# Patient Record
Sex: Female | Born: 1995 | Race: Black or African American | Hispanic: No | Marital: Single | Smoking: Never smoker
Health system: Southern US, Community
[De-identification: ages and names within clinical notes are randomized; demographics above are authoritative.]

## PROBLEM LIST (undated history)

## (undated) DIAGNOSIS — J45909 Unspecified asthma, uncomplicated: Secondary | ICD-10-CM

## (undated) HISTORY — PX: OTHER SURGICAL HISTORY: SHX169

## (undated) HISTORY — PX: NO PAST SURGERIES: SHX2092

---
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
## 2015-08-23 ENCOUNTER — Ambulatory Visit
Admission: EM | Admit: 2015-08-23 | Discharge: 2015-08-23 | Disposition: A | Discharge status: Home / Self Care | Payer: Federal, State, Local not specified - PPO | Attending: Emergency Medicine | Admitting: Emergency Medicine

## 2015-08-23 ENCOUNTER — Encounter: Payer: Self-pay | Admitting: *Deleted

## 2015-08-23 DIAGNOSIS — H6593 Unspecified nonsuppurative otitis media, bilateral: Secondary | ICD-10-CM

## 2015-08-23 DIAGNOSIS — J302 Other seasonal allergic rhinitis: Secondary | ICD-10-CM

## 2015-08-23 DIAGNOSIS — J0111 Acute recurrent frontal sinusitis: Secondary | ICD-10-CM | POA: Diagnosis not present

## 2015-08-23 LAB — RAPID STREP SCREEN (MED CTR MEBANE ONLY): Streptococcus, Group A Screen (Direct): NEGATIVE

## 2015-08-23 MED ORDER — FLUTICASONE PROPIONATE 50 MCG/ACT NA SUSP: 1.0000 | Freq: Two times a day (BID) | NASAL | Status: DC

## 2015-08-23 MED ORDER — AMOXICILLIN-POT CLAVULANATE 875-125 MG PO TABS: 1.0000 | ORAL_TABLET | Freq: Two times a day (BID) | ORAL | Status: DC

## 2015-08-23 MED ORDER — SALINE SPRAY 0.65 % NA SOLN: 2.0000 | NASAL | Status: DC

## 2015-08-23 NOTE — Discharge Instructions (Signed)
Allergic Rhinitis Allergic rhinitis is when the mucous membranes in the nose respond to allergens. Allergens are particles in the air that cause your body to have an allergic reaction. This causes you to release allergic antibodies. Through a chain of events, these eventually cause you to release histamine into the blood stream. Although meant to protect the body, it is this release of histamine that causes your discomfort, such as frequent sneezing, congestion, and an itchy, runny nose.  CAUSES Seasonal allergic rhinitis (hay fever) is caused by pollen allergens that may come from grasses, trees, and weeds. Year-round allergic rhinitis (perennial allergic rhinitis) is caused by allergens such as house dust mites, pet dander, and mold spores. SYMPTOMS  Nasal stuffiness (congestion).  Itchy, runny nose with sneezing and tearing of the eyes. DIAGNOSIS Your health care provider can help you determine the allergen or allergens that trigger your symptoms. If you and your health care provider are unable to determine the allergen, skin or blood testing may be used. Your health care provider will diagnose your condition after taking your health history and performing a physical exam. Your health care provider may assess you for other related conditions, such as asthma, pink eye, or an ear infection. TREATMENT Allergic rhinitis does not have a cure, but it can be controlled by:  Medicines that block allergy symptoms. These may include allergy shots, nasal sprays, and oral antihistamines.  Avoiding the allergen. Hay fever may often be treated with antihistamines in pill or nasal spray forms. Antihistamines block the effects of histamine. There are over-the-counter medicines that may help with nasal congestion and swelling around the eyes. Check with your health care provider before taking or giving this medicine. If avoiding the allergen or the medicine prescribed do not work, there are many new medicines  your health care provider can prescribe. Stronger medicine may be used if initial measures are ineffective. Desensitizing injections can be used if medicine and avoidance does not work. Desensitization is when a patient is given ongoing shots until the body becomes less sensitive to the allergen. Make sure you follow up with your health care provider if problems continue. HOME CARE INSTRUCTIONS It is not possible to completely avoid allergens, but you can reduce your symptoms by taking steps to limit your exposure to them. It helps to know exactly what you are allergic to so that you can avoid your specific triggers. SEEK MEDICAL CARE IF:  You have a fever.  You develop a cough that does not stop easily (persistent).  You have shortness of breath.  You start wheezing.  Symptoms interfere with normal daily activities.   This information is not intended to replace advice given to you by your health care provider. Make sure you discuss any questions you have with your health care provider.   Document Released: 10/21/2000 Document Revised: 02/16/2014 Document Reviewed: 10/03/2012 Elsevier Interactive Patient Education 2016 [employer] Pharyngitis Pharyngitis is redness, pain, and swelling (inflammation) of your pharynx.  CAUSES  Pharyngitis is usually caused by infection. Most of the time, these infections are from viruses (viral) and are part of a cold. However, sometimes pharyngitis is caused by bacteria (bacterial). Pharyngitis can also be caused by allergies. Viral pharyngitis may be spread from person to person by coughing, sneezing, and personal items or utensils (cups, forks, spoons, toothbrushes). Bacterial pharyngitis may be spread from person to person by more intimate contact, such as kissing.  SIGNS AND SYMPTOMS  Symptoms of pharyngitis include:   Sore throat.  Tiredness (fatigue).   Low-grade fever.   Headache.  Joint pain and muscle aches.  Skin  rashes.  Swollen lymph nodes.  Plaque-like film on throat or tonsils (often seen with bacterial pharyngitis). DIAGNOSIS  Your health care provider will ask you questions about your illness and your symptoms. Your medical history, along with a physical exam, is often all that is needed to diagnose pharyngitis. Sometimes, a rapid strep test is done. Other lab tests may also be done, depending on the suspected cause.  TREATMENT  Viral pharyngitis will usually get better in 3-4 days without the use of medicine. Bacterial pharyngitis is treated with medicines that kill germs (antibiotics).  HOME CARE INSTRUCTIONS   Drink enough water and fluids to keep your urine clear or pale yellow.   Only take over-the-counter or prescription medicines as directed by your health care provider:   If you are prescribed antibiotics, make sure you finish them even if you start to feel better.   Do not take aspirin.   Get lots of rest.   Gargle with 8 oz of salt water ( tsp of salt per 1 qt of water) as often as every 1-2 hours to soothe your throat.   Throat lozenges (if you are not at risk for choking) or sprays may be used to soothe your throat. SEEK MEDICAL CARE IF:   You have large, tender lumps in your neck.  You have a rash.  You cough up green, yellow-brown, or bloody spit. SEEK IMMEDIATE MEDICAL CARE IF:   Your neck becomes stiff.  You drool or are unable to swallow liquids.  You vomit or are unable to keep medicines or liquids down.  You have severe pain that does not go away with the use of recommended medicines.  You have trouble breathing (not caused by a stuffy nose). MAKE SURE YOU:   Understand these instructions.  Will watch your condition.  Will get help right away if you are not doing well or get worse.   This information is not intended to replace advice given to you by your health care provider. Make sure you discuss any questions you have with your health care  provider.   Document Released: 01/26/2005 Document Revised: 11/16/2012 Document Reviewed: 10/03/2012 Elsevier Interactive Patient Education 2016 [employer] Otitis Media With Effusion Otitis media with effusion is the presence of fluid in the middle ear. This is a common problem in children, which often follows ear infections. It may be present for weeks or longer after the infection. Unlike an acute ear infection, otitis media with effusion refers only to fluid behind the ear drum and not infection. Children with repeated ear and sinus infections and allergy problems are the most likely to get otitis media with effusion. CAUSES  The most frequent cause of the fluid buildup is dysfunction of the eustachian tubes. These are the tubes that drain fluid in the ears to the back of the nose (nasopharynx). SYMPTOMS   The main symptom of this condition is hearing loss. As a result, you or your child may:  Listen to the TV at a loud volume.  Not respond to questions.  Ask "what" often when spoken to.  Mistake or confuse one sound or word for another.  There may be a sensation of fullness or pressure but usually not pain. DIAGNOSIS   Your health care provider will diagnose this condition by examining you or your child's ears.  Your health care provider may test the pressure in you  or your child's ear with a tympanometer.  A hearing test may be conducted if the problem persists. TREATMENT   Treatment depends on the duration and the effects of the effusion.  Antibiotics, decongestants, nose drops, and cortisone-type drugs (tablets or nasal spray) may not be helpful.  Children with persistent ear effusions may have delayed language or behavioral problems. Children at risk for developmental delays in hearing, learning, and speech may require referral to a specialist earlier than children not at risk.  You or your child's health care provider may suggest a referral to an ear, nose, and throat  surgeon for treatment. The following may help restore normal hearing:  Drainage of fluid.  Placement of ear tubes (tympanostomy tubes).  Removal of adenoids (adenoidectomy). HOME CARE INSTRUCTIONS   Avoid secondhand smoke.  Infants who are breastfed are less likely to have this condition.  Avoid feeding infants while they are lying flat.  Avoid known environmental allergens.  Avoid people who are sick. SEEK MEDICAL CARE IF:   Hearing is not better in 3 months.  Hearing is worse.  Ear pain.  Drainage from the ear.  Dizziness. MAKE SURE YOU:   Understand these instructions.  Will watch your condition.  Will get help right away if you are not doing well or get worse.   This information is not intended to replace advice given to you by your health care provider. Make sure you discuss any questions you have with your health care provider.   Document Released: 03/05/2004 Document Revised: 02/16/2014 Document Reviewed: 08/23/2012 Elsevier Interactive Patient Education 2016 [employer] Sinusitis, Adult Sinusitis is redness, soreness, and inflammation of the paranasal sinuses. Paranasal sinuses are air pockets within the bones of your face. They are located beneath your eyes, in the middle of your forehead, and above your eyes. In healthy paranasal sinuses, mucus is able to drain out, and air is able to circulate through them by way of your nose. However, when your paranasal sinuses are inflamed, mucus and air can become trapped. This can allow bacteria and other germs to grow and cause infection. Sinusitis can develop quickly and last only a short time (acute) or continue over a long period (chronic). Sinusitis that lasts for more than 12 weeks is considered chronic. CAUSES Causes of sinusitis include:  Allergies.  Structural abnormalities, such as displacement of the cartilage that separates your nostrils (deviated septum), which can decrease the air flow through your  nose and sinuses and affect sinus drainage.  Functional abnormalities, such as when the small hairs (cilia) that line your sinuses and help remove mucus do not work properly or are not present. SIGNS AND SYMPTOMS Symptoms of acute and chronic sinusitis are the same. The primary symptoms are pain and pressure around the affected sinuses. Other symptoms include:  Upper toothache.  Earache.  Headache.  Bad breath.  Decreased sense of smell and taste.  A cough, which worsens when you are lying flat.  Fatigue.  Fever.  Thick drainage from your nose, which often is green and may contain pus (purulent).  Swelling and warmth over the affected sinuses. DIAGNOSIS Your health care provider will perform a physical exam. During your exam, your health care provider may perform any of the following to help determine if you have acute sinusitis or chronic sinusitis:  Look in your nose for signs of abnormal growths in your nostrils (nasal polyps).  Tap over the affected sinus to check for signs of infection.  View the inside of your  sinuses using an imaging device that has a light attached (endoscope). If your health care provider suspects that you have chronic sinusitis, one or more of the following tests may be recommended:  Allergy tests.  Nasal culture. A sample of mucus is taken from your nose, sent to a lab, and screened for bacteria.  Nasal cytology. A sample of mucus is taken from your nose and examined by your health care provider to determine if your sinusitis is related to an allergy. TREATMENT Most cases of acute sinusitis are related to a viral infection and will resolve on their own within 10 days. Sometimes, medicines are prescribed to help relieve symptoms of both acute and chronic sinusitis. These may include pain medicines, decongestants, nasal steroid sprays, or saline sprays. However, for sinusitis related to a bacterial infection, your health care provider will prescribe  antibiotic medicines. These are medicines that will help kill the bacteria causing the infection. Rarely, sinusitis is caused by a fungal infection. In these cases, your health care provider will prescribe antifungal medicine. For some cases of chronic sinusitis, surgery is needed. Generally, these are cases in which sinusitis recurs more than 3 times per year, despite other treatments. HOME CARE INSTRUCTIONS  Drink plenty of water. Water helps thin the mucus so your sinuses can drain more easily.  Use a humidifier.  Inhale steam 3-4 times a day (for example, sit in the bathroom with the shower running).  Apply a warm, moist washcloth to your face 3-4 times a day, or as directed by your health care provider.  Use saline nasal sprays to help moisten and clean your sinuses.  Take medicines only as directed by your health care provider.  If you were prescribed either an antibiotic or antifungal medicine, finish it all even if you start to feel better. SEEK IMMEDIATE MEDICAL CARE IF:  You have increasing pain or severe headaches.  You have nausea, vomiting, or drowsiness.  You have swelling around your face.  You have vision problems.  You have a stiff neck.  You have difficulty breathing.   This information is not intended to replace advice given to you by your health care provider. Make sure you discuss any questions you have with your health care provider.   Document Released: 01/26/2005 Document Revised: 02/16/2014 Document Reviewed: 02/10/2011 Else[employer] Interactive Patient Education [employer]2016 [employer]

## 2015-08-23 NOTE — ED Provider Notes (Signed)
CSN: [id number]     Arrival date & time 08/23/15  1044 History   First MD Initiated Contact with Patient 08/23/15 1111     Chief Complaint  Patient presents with  . 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 female presenting with pharyngitis. The history is provided by the patient.  Sore Throat This is a recurrent problem. The current episode started 2 days ago. The problem occurs constantly. The problem has been gradually worsening. Associated symptoms include headaches. Pertinent negatives include no chest pain, no abdominal pain and no shortness of breath. The symptoms are aggravated by eating, drinking and swallowing. Nothing relieves the symptoms. She has tried rest, food and water for the symptoms. The treatment provided mild relief.    Past Medical History  Diagnosis Date  . Asthma    History reviewed. No pertinent past surgical history. History reviewed. No pertinent family history. Social History  Substance Use Topics  . Smoking status: Never Smoker   . Smokeless tobacco: Never Used  . Alcohol Use: No   OB History    No data available     Review of Systems  Constitutional: Negative for fever, chills, diaphoresis, activity change, appetite change, fatigue and unexpected weight change.  HENT: Positive for congestion, ear pain and postnasal drip. Negative for dental problem, drooling, ear discharge, facial swelling, hearing loss, mouth sores, nosebleeds, rhinorrhea, sinus pressure, sneezing, sore throat, tinnitus, trouble swallowing and voice change.   Eyes: Negative for photophobia, pain, discharge, redness, itching and visual disturbance.  Respiratory: Negative  for cough, choking, chest tightness, shortness of breath, wheezing and stridor.   Cardiovascular: Negative for chest pain, palpitations and leg swelling.  Gastrointestinal: Negative for nausea, vomiting, abdominal pain, diarrhea, constipation, blood in stool and abdominal distention.  Endocrine: Negative for cold intolerance and heat intolerance.  Genitourinary: Negative for dysuria, hematuria and difficulty urinating.  Musculoskeletal: Negative for myalgias, back pain, joint swelling, arthralgias, gait problem, neck pain and neck stiffness.  Skin: Negative for color change, pallor, rash and wound.  Allergic/Immunologic: Positive for environmental allergies. Negative for food allergies.  Neurological: Positive for headaches. Negative for dizziness, tremors, seizures, syncope, facial asymmetry, speech difficulty, weakness, light-headedness and numbness.  Hematological: Negative for adenopathy. Does not bruise/bleed easily.  Psychiatric/Behavioral: Negative for behavioral problems, confusion, sleep disturbance and agitation.    Allergies  Review of patient's allergies indicates no known allergies.  Home Medications   Prior to Admission medications   Medication Sig Start Date End Date Taking? Authorizing Provider  beclomethasone (QVAR) 80 MCG/ACT inhaler Inhale 2 puffs into the lungs as needed.   Yes Historical Provider, MD  montelukast (SINGULAIR) 10 MG tablet Take 10 mg by mouth at bedtime.   Yes Historical Provider, MD  acetaminophen (TYLENOL) 500 MG tablet Take 2 tablets (1,000 mg total) by mouth every 6 (six) hours as needed for moderate pain. 11/09/14   Barbaraann Barthelina A Zannah Melucci, NP  amoxicillin-clavulanate (AUGMENTIN) 875-125 MG tablet Take 1 tablet by mouth every 12 (twelve) hours. 08/23/15   Barbaraann Barthelina A Lakeyta Vandenheuvel, NP  fluticasone (FLONASE) 50 MCG/ACT nasal spray Place 1 spray into both nostrils 2 (two) times daily. 08/23/15   Barbaraann Barthelina A Brittannie Tawney, NP  sodium chloride (OCEAN) 0.65 % SOLN nasal spray  Place 2 sprays into  both nostrils every 2 (two) hours while awake. 08/23/15 09/09/15  Barbaraann Barthel, NP   Meds Ordered and Administered this Visit  Medications - No data to display  BP 115/80 mmHg  Pulse 71  Temp(Src) 98.5 F (36.9 C) (Oral)  Resp 18  Ht  (1.575 m)  Wt 140 lb (63.504 kg)  BMI 25.60 kg/m2  SpO2 100%  LMP 07/27/2015 No data found.   Physical Exam  Constitutional: She is oriented to person, place, and time. She appears well-developed and well-nourished. She is active and cooperative.  Non-toxic appearance. She does not have a sickly appearance. She does not appear ill. No distress.  HENT:  Head: Normocephalic and atraumatic.  Right Ear: Hearing, external ear and ear canal normal. A middle ear effusion is present.  Left Ear: Hearing, external ear and ear canal normal. A middle ear effusion is present.  Nose: Mucosal edema and rhinorrhea present. No nose lacerations, sinus tenderness, nasal deformity, septal deviation or nasal septal hematoma. No epistaxis.  No foreign bodies. Right sinus exhibits no maxillary sinus tenderness and no frontal sinus tenderness. Left sinus exhibits no maxillary sinus tenderness and no frontal sinus tenderness.  Mouth/Throat: Uvula is midline and mucous membranes are normal. Mucous membranes are not pale, not dry and not cyanotic. She does not have dentures. No oral lesions. No trismus in the jaw. Normal dentition. No dental abscesses, uvula swelling, lacerations or dental caries. Posterior oropharyngeal edema and posterior oropharyngeal erythema present. No oropharyngeal exudate or tonsillar abscesses.  Cobblestoning posterior pharynx; tonsils 1+/4 bilaterally edema/erythema; bilateral allergic shiners; bilateral nasal turbinates edema/erythema/white discharge  Eyes: Conjunctivae, EOM and lids are normal. Pupils are equal, round, and reactive to light. Right eye exhibits no chemosis, no discharge, no exudate and no hordeolum. No foreign  body present in the right eye. Left eye exhibits no chemosis, no discharge, no exudate and no hordeolum. No foreign body present in the left eye. Right conjunctiva is not injected. Right conjunctiva has no hemorrhage. Left conjunctiva is not injected. Left conjunctiva has no hemorrhage. No scleral icterus. Right eye exhibits normal extraocular motion and no nystagmus. Left eye exhibits normal extraocular motion and no nystagmus. Right pupil is round and reactive. Left pupil is round and reactive. Pupils are equal.  Neck: Trachea normal and normal range of motion. Neck supple. No tracheal tenderness, no spinous process tenderness and no muscular tenderness present. No rigidity. No tracheal deviation, no edema, no erythema and normal range of motion present. No thyroid mass and no thyromegaly present.  Cardiovascular: Normal rate, regular rhythm, S1 normal, S2 normal, normal heart sounds and intact distal pulses.  PMI is not displaced.  Exam reveals no gallop and no friction rub.   No murmur heard. Pulmonary/Chest: Effort normal and breath sounds normal. No accessory muscle usage or stridor. No respiratory distress. She has no decreased breath sounds. She has no wheezes. She has no rhonchi. She has no rales. She exhibits no tenderness.  Abdominal: Soft. She exhibits no distension.  Musculoskeletal: Normal range of motion. She exhibits no edema or tenderness.       Right shoulder: Normal.       Left shoulder: Normal.       Right hip: Normal.       Left hip: Normal.       Right knee: Normal.       Left knee: Normal.       Cervical back: Normal.       Right hand: Normal.  Left hand: Normal.  Lymphadenopathy:       Head (right side): No submental, no submandibular, no tonsillar, no preauricular, no posterior auricular and no occipital adenopathy present.       Head (left side): No submental, no submandibular, no tonsillar, no preauricular, no posterior auricular and no occipital adenopathy present.     She has no cervical adenopathy.       Right cervical: No superficial cervical, no deep cervical and no posterior cervical adenopathy present.      Left cervical: No superficial cervical, no deep cervical and no posterior cervical adenopathy present.  Neurological: She is alert and oriented to person, place, and time. She has normal strength. She is not disoriented. She displays no atrophy and no tremor. No cranial nerve deficit or sensory deficit. She exhibits normal muscle tone. She displays no seizure activity. Coordination and gait normal. GCS eye subscore is 4. GCS verbal subscore is 5. GCS motor subscore is 6.  Skin: Skin is warm, dry and intact. No abrasion, no bruising, no burn, no ecchymosis, no laceration, no lesion, no petechiae and no rash noted. She is not diaphoretic. No cyanosis or erythema. No pallor. Nails show no clubbing.  Psychiatric: She has a normal mood and affect. Her speech is normal and behavior is normal. Judgment and thought content normal. Cognition and memory are normal.  Nursing note and vitals reviewed.   ED Course  Procedures (including critical care time)  Labs Review Labs Reviewed  RAPID STREP SCREEN (NOT AT [facility])  CULTURE, GROUP A STREP [facility])    Imaging Review No results found.     MDM   1. Acute recurrent frontal sinusitis   2. Otitis media with effusion, bilateral   3. Seasonal allergic rhinitis    Patient may use normal saline nasal spray as needed.  Continue zyrtec and singulair 10mg  po daily patient just recently restarted.  Trial nasal steroid use flonase 1 spray each nostril BID.  Avoid triggers if possible.  Shower prior to bedtime if exposed to triggers.  If allergic dust/dust mites recommend mattress/pillow covers/encasements; washing linens, vacuuming, sweeping, dusting weekly.  Call or return to clinic as needed if these symptoms worsen or fail to improve as anticipated.   Exitcare handout on allergic rhinitis given to patient.   Patient verbalized understanding of instructions, agreed with plan of care and had no further questions at this time.  P2:  Avoidance and hand washing.  Supportive treatment.   No evidence of invasive bacterial infection, non toxic and well hydrated.  This is most likely self limiting viral infection.  I do not see where any further testing or imaging is necessary at this time.   I will suggest supportive care, rest, good hygiene and encourage the patient to take adequate fluids.  The patient is to return to clinic or EMERGENCY ROOM if symptoms worsen or change significantly e.g. ear pain, fever, purulent discharge from ears or bleeding.  Exitcare handout on otitis media with effusion given to patient.  Patient verbalized agreement and understanding of treatment plan.    Patient notified rapid strep negative.   Will call with throat culture results typically 48 hours once available.  School excuse given 24 hours/today.  I do not see where any further testing or imaging is necessary at this time.   I will suggest supportive care, rest, good hygiene and encourage the patient to take adequate fluids.  nasal saline 1-2 sprays each nostril prn q2h, motrin 800mg  po TID prn.  Discussed honey with lemon and salt water gargles for comfort also.  The patient is to return to clinic or EMERGENCY ROOM if symptoms worsen or change significantly e.g. fever, lethargy, SOB, wheezing.  Exitcare handout on viral illness given to patient.  Patient verbalized agreement and understanding of treatment plan.    Start augmentin 875mg  po BID in 48 hours if no improvement with flonase and saline.  No evidence of systemic bacterial infection, non toxic and well hydrated.  I do not see where any further testing or imaging is necessary at this time.   I will suggest supportive care, rest, good hygiene and encourage the patient to take adequate fluids.  The patient is to return to clinic or EMERGENCY ROOM if symptoms worsen or change  significantly.  Exitcare handout on sinusitis given to patient.  Patient verbalized agreement and understanding of treatment plan and had no further questions at this time.   P2:  Hand washing and cover cough  Restart flonase 1 spray each nostril BID, saline 2 sprays each nostril q2h prn congestion.  If no improvement with 48 hours of saline and flonase use start augmentin 875mg  po BID x 10 days.  Rx given.  No evidence of systemic bacterial infection, non toxic and well hydrated.  I do not see where any further testing or imaging is necessary at this time.   I will suggest supportive care, rest, good hygiene and encourage the patient to take adequate fluids.  The patient is to return to clinic or EMERGENCY ROOM if symptoms worsen or change significantly.  Exitcare handout on sinusitis given to patient.  Patient verbalized agreement and understanding of treatment plan and had no further questions at this time.   P2:  Hand washing and cover cough  School/work excuse note given to patient for 24 hours.  Usually no specific medical treatment is needed if a virus is causing the sore throat.  The throat most often gets better on its own within 5 to 7 days.  Antibiotic medicine does not cure viral pharyngitis.   For acute pharyngitis caused by bacteria, your healthcare provider will prescribe an antibiotic.  Marland Kitchen Do not smoke.  Marland Kitchen Avoid secondhand smoke and other air pollutants.  . Use a cool mist humidifier to add moisture to the air.  . Get plenty of rest.  . You may want to rest your throat by talking less and eating a diet that is mostly liquid or soft for a day or two.   Marland Kitchen Nonprescription throat lozenges and mouthwashes should help relieve the soreness.   . Gargling with warm saltwater and drinking warm liquids may help.  (You can make a saltwater solution by adding 1/4 teaspoon of salt to 8 ounces, or 240 mL, of warm water.)  . A nonprescription pain reliever such as tylenol 1000mg  po QID prn pain may  ease general aches and pains.   FOLLOW UP with clinic provider if no improvements in the next 7-10 days.  Patient verbalized understanding of instructions and agreed with plan of care. P2:  Hand washing and diet.    Barbaraann Barthel, NP 08/23/15 1545

## 2015-08-23 NOTE — ED Notes (Signed)
Patient started having sore throat symptoms 2 days ago. Patient reports history of strep throat in the past during the summer.

## 2015-08-25 LAB — CULTURE, GROUP A STREP (THRC)

## 2015-08-29 ENCOUNTER — Telehealth: Payer: Self-pay | Admitting: General Practice

## 2015-08-29 NOTE — Telephone Encounter (Signed)
Mother notified throat culture normal negative.  Mother reported patient taking her meds and feeling better.  No further questions at this time, verbalized understanding information.

## 2015-10-30 ENCOUNTER — Ambulatory Visit
Admission: EM | Admit: 2015-10-30 | Discharge: 2015-10-30 | Disposition: A | Discharge status: Home / Self Care | Payer: Federal, State, Local not specified - PPO | Attending: Family Medicine | Admitting: Family Medicine

## 2015-10-30 ENCOUNTER — Telehealth: Payer: Self-pay | Admitting: Family Medicine

## 2015-10-30 DIAGNOSIS — L739 Follicular disorder, unspecified: Secondary | ICD-10-CM | POA: Diagnosis not present

## 2015-10-30 MED ORDER — SULFAMETHOXAZOLE-TRIMETHOPRIM 800-160 MG PO TABS: 1.0000 | ORAL_TABLET | Freq: Two times a day (BID) | ORAL | 0 refills | Status: AC

## 2015-10-30 MED ORDER — FLUCONAZOLE 150 MG PO TABS: 150.0000 mg | ORAL_TABLET | Freq: Every day | ORAL | 0 refills | Status: DC

## 2015-10-30 NOTE — ED Triage Notes (Signed)
Patient c/o of a bump on her scalp that is causing her a lot of pain. She says it hurts to pull up her hair, she has to take tylenol to be able to do this. The bump appeared a couple of days ago.

## 2015-10-30 NOTE — Discharge Instructions (Signed)
Keep area clean and dry as discussed. Seek medical attention if symptoms persist or worsen as discussed.

## 2015-10-30 NOTE — ED Provider Notes (Signed)
[facility]    CSN: [id number] Arrival date & time: 10/30/15  [id number]  First Provider Contact:  None       History   Chief Complaint Chief Complaint  Patient presents with  . Rash    HPI Jordan Solis is a 20 y.o. female.   HPI: Patient presents today with symptoms of tender raised area on the right occiput. Patient states that she's had this for the past week. It has slowly gotten worse. She notices some sensations proximal to the area. She denies any URI symptoms, fever, malaise. She admits to wearing her hair up often. She does use oils in her hair and also braids her hair at times. She denies any new products in her hair. Past Medical History:  Diagnosis Date  . Asthma     There are no active problems to display for this patient.   History reviewed. No pertinent surgical history.  OB History    No data available       Home Medications    Prior to Admission medications   Medication Sig Start Date End Date Taking? Authorizing Provider  acetaminophen (TYLENOL) 500 MG tablet Take 2 tablets (1,000 mg total) by mouth every 6 (six) hours as needed for moderate pain. 11/09/14  Yes Tina A Betancourt, NP  montelukast (SINGULAIR) 10 MG tablet Take 10 mg by mouth at bedtime.   Yes Historical Provider, MD  amoxicillin-clavulanate (AUGMENTIN) 875-125 MG tablet Take 1 tablet by mouth every 12 (twelve) hours. 08/23/15   Barbaraann Barthelina A Betancourt, NP  beclomethasone (QVAR) 80 MCG/ACT inhaler Inhale 2 puffs into the lungs as needed.    Historical Provider, MD  fluticasone (FLONASE) 50 MCG/ACT nasal spray Place 1 spray into both nostrils 2 (two) times daily. 08/23/15   Barbaraann Barthelina A Betancourt, NP  sodium chloride (OCEAN) 0.65 % SOLN nasal spray Place 2 sprays into both nostrils every 2 (two) hours while awake. 08/23/15 09/09/15  Barbaraann Barthelina A Betancourt, NP  sulfamethoxazole-trimethoprim (BACTRIM DS,SEPTRA DS) 800-160 MG tablet Take 1 tablet by mouth 2 (two) times daily. 10/30/15 11/04/15  Jolene ProvostKirtida  Margene Cherian, MD    Family History History reviewed. No pertinent family history.  Social History Social History  Substance Use Topics  . Smoking status: Never Smoker  . Smokeless tobacco: Never Used  . Alcohol use No     Allergies   Review of patient's allergies indicates no known allergies.   Review of Systems Review of Systems: Negative except mentioned above.    Physical Exam Triage Vital Signs ED Triage Vitals  Enc Vitals Group     BP 10/30/15 0903 104/65     Pulse Rate 10/30/15 0903 72     Resp 10/30/15 0903 18     Temp 10/30/15 0903 98.1 F (36.7 C)     Temp Source 10/30/15 0903 Oral     SpO2 10/30/15 0903 100 %     Weight 10/30/15 0901 145 lb (65.8 kg)     Height 10/30/15 0901 5\' 3"  (1.6 m)     Head Circumference --      Peak Flow --      Pain Score 10/30/15 0902 7     Pain Loc --      Pain Edu? --      Excl. in GC? --    No data found.   Updated Vital Signs BP 104/65 (BP Location: Left Arm)   Pulse 72   Temp 98.1 F (36.7 C) (Oral)   Resp 18  Ht 5\' 3"  (1.6 m)   Wt 145 lb (65.8 kg)   LMP 09/26/2015   SpO2 100%   BMI 25.69 kg/m     Physical Exam:  GENERAL: NAD HEENT: no pharyngeal erythema, no exudate, no erythema of TMs, no cervical LAD RESP: CTA B CARD: RRR SKIN: approx. 5mm circular raised area of erythema with small white head, no drainage from site, mild tenderness of site, no other areas similar to this on scalp appreciated  NEURO: CN II-XII grossly intact    UC Treatments / Results  Labs (all labs ordered are listed, but only abnormal results are displayed) Labs Reviewed - No data to display  EKG  EKG Interpretation None       Radiology No results found.  Procedures Procedures (including critical care time)  Medications Ordered in UC Medications - No data to display   Initial Impression / Assessment and Plan / UC Course  I have reviewed the triage vital signs and the nursing notes.  Pertinent labs & imaging results  that were available during my care of the patient were reviewed by me and considered in my medical decision making (see chart for details).  Clinical Course   A/P: Folliculitis/Furuncle - Will treat with a few days of oral antibiotic, lesion doesn't look like shingles and there are no other lesions, encouraged warm compresses on area, avoid putting hair up tight, avoid other chemicals/oils in hair, use no fragrance antibacterial shampoo/conditioner. Seek medical attention if symptoms persist or worsen as discussed.     Final Clinical Impressions(s) / UC Diagnoses   Final diagnoses:  Folliculitis    New Prescriptions New Prescriptions   SULFAMETHOXAZOLE-TRIMETHOPRIM (BACTRIM DS,SEPTRA DS) 800-160 MG TABLET    Take 1 tablet by mouth 2 (two) times daily.     Jolene Provost, MD 10/30/15 [phone]

## 2016-05-01 DIAGNOSIS — M419 Scoliosis, unspecified: Secondary | ICD-10-CM | POA: Insufficient documentation

## 2016-05-01 DIAGNOSIS — N939 Abnormal uterine and vaginal bleeding, unspecified: Secondary | ICD-10-CM | POA: Insufficient documentation

## 2016-05-01 DIAGNOSIS — E559 Vitamin D deficiency, unspecified: Secondary | ICD-10-CM | POA: Insufficient documentation

## 2016-08-20 ENCOUNTER — Ambulatory Visit
Admission: EM | Admit: 2016-08-20 | Discharge: 2016-08-20 | Disposition: A | Discharge status: Home / Self Care | Payer: Federal, State, Local not specified - PPO | Attending: Family Medicine | Admitting: Family Medicine

## 2016-08-20 DIAGNOSIS — J3089 Other allergic rhinitis: Secondary | ICD-10-CM | POA: Diagnosis not present

## 2016-08-20 DIAGNOSIS — J45909 Unspecified asthma, uncomplicated: Secondary | ICD-10-CM

## 2016-08-20 DIAGNOSIS — R0981 Nasal congestion: Secondary | ICD-10-CM

## 2016-08-20 MED ORDER — FLUTICASONE-SALMETEROL 250-50 MCG/DOSE IN AEPB: 1.0000 | INHALATION_SPRAY | Freq: Two times a day (BID) | RESPIRATORY_TRACT | 0 refills | Status: DC

## 2016-08-20 MED ORDER — RANITIDINE HCL 150 MG PO CAPS: 150.0000 mg | ORAL_CAPSULE | Freq: Two times a day (BID) | ORAL | 0 refills | Status: DC

## 2016-08-20 MED ORDER — FLUTICASONE PROPIONATE 50 MCG/ACT NA SUSP: 2.0000 | Freq: Every day | NASAL | 0 refills | Status: DC

## 2016-08-20 NOTE — ED Triage Notes (Signed)
Patient complains of allergies to pet dander after doing the laundry. Patient states that 2 days ago she had an asthma attack and started allegra to help. Patient states that she has sinus pain and pressure and fullness in her face. Patient states that she has been noticing some shortness of breath since 2 days ago.

## 2016-08-20 NOTE — Discharge Instructions (Signed)
Continue with using rescue inhaler as needed continue with her Singulair and use Claritin daily and Zyrtec as needed if more H1 blockade is needed

## 2016-08-20 NOTE — ED Provider Notes (Signed)
[facility]    CSN: [id number] Arrival date & time: 08/20/16  1100     History   Chief Complaint Chief Complaint  Patient presents with  . Allergies    HPI Jordan Solis is a 21 y.o. female.   Patient is 21 year old black female out allergies to penicillin who reports increased nasal congestion and allergies. She's been exposed to more animals lately due to living environment and states she's having more trouble with nasal congestion feeling bad and also wheezing and shortness of breath. She states she feels overall visible and then she started becoming short of breath and difficulty breathing. She is taking Claritin and Zyrtec about 12 hours apart as needed she uses albuterol inhaler when needed for her increased bronchospasm and she is in Singulair. Despite that she still having more trouble she stop using the nasal sprays cousins she could taste them. She does not smoke, no previous surgeries no of chronic medical problems no pertinent family medical history relevant to today's visit.   The history is provided by the patient. No language interpreter was used.    Past Medical History:  Diagnosis Date  . Asthma     There are no active problems to display for this patient.   Past Surgical History:  Procedure Laterality Date  . NO PAST SURGERIES      OB History    No data available       Home Medications    Prior to Admission medications   Medication Sig Start Date End Date Taking? Authorizing Provider  albuterol (PROVENTIL HFA;VENTOLIN HFA) 108 (90 Base) MCG/ACT inhaler Inhale into the lungs every 6 (six) hours as needed for wheezing or shortness of breath.   Yes [provider]  beclomethasone (QVAR) 80 MCG/ACT inhaler Inhale 2 puffs into the lungs as needed.   Yes [provider]  fexofenadine (ALLEGRA) 180 MG tablet Take 180 mg by mouth daily.   Yes [provider]  fluticasone (FLONASE) 50 MCG/ACT nasal spray Place 1  spray into both nostrils 2 (two) times daily. 08/23/15  Yes Betancourt, Jarold Songina A, NP  montelukast (SINGULAIR) 10 MG tablet Take 10 mg by mouth at bedtime.   Yes [provider]  acetaminophen (TYLENOL) 500 MG tablet Take 2 tablets (1,000 mg total) by mouth every 6 (six) hours as needed for moderate pain. 11/09/14   Betancourt, Jarold Songina A, NP  amoxicillin-clavulanate (AUGMENTIN) 875-125 MG tablet Take 1 tablet by mouth every 12 (twelve) hours. 08/23/15   Betancourt, Jarold Songina A, NP  fluconazole (DIFLUCAN) 150 MG tablet Take 1 tablet (150 mg total) by mouth daily. 10/30/15   Jolene ProvostPatel, Kirtida, MD  fluticasone (FLONASE) 50 MCG/ACT nasal spray Place 2 sprays into both nostrils daily. 08/20/16   Hassan RowanWade, Ely Spragg, MD  Fluticasone-Salmeterol (ADVAIR DISKUS) 250-50 MCG/DOSE AEPB Inhale 1 puff into the lungs 2 (two) times daily. 08/20/16   Hassan RowanWade, Cythina Mickelsen, MD  ranitidine (ZANTAC) 150 MG capsule Take 1 capsule (150 mg total) by mouth 2 (two) times daily. 08/20/16   Hassan RowanWade, Jonmarc Bodkin, MD  sodium chloride (OCEAN) 0.65 % SOLN nasal spray Place 2 sprays into both nostrils every 2 (two) hours while awake. 08/23/15 09/09/15  BetancourtJarold Song, Tina A, NP    Family History History reviewed. No pertinent family history.  Social History Social History  Substance Use Topics  . Smoking status: Never Smoker  . Smokeless tobacco: Never Used  . Alcohol use No     Allergies   Penicillins   Review of Systems  Review of Systems  HENT: Positive for congestion, facial swelling, rhinorrhea and sinus pressure.   Eyes: Positive for itching.  All other systems reviewed and are negative.    Physical Exam Triage Vital Signs ED Triage Vitals  Enc Vitals Group     BP 08/20/16 1135 119/72     Pulse Rate 08/20/16 1135 98     Resp 08/20/16 1135 18     Temp 08/20/16 1135 99 F (37.2 C)     Temp Source 08/20/16 1135 Oral     SpO2 08/20/16 1135 100 %     Weight 08/20/16 1132 150 lb (68 kg)     Height 08/20/16 1132 5\' 2"  (1.575 m)     Head  Circumference --      Peak Flow --      Pain Score 08/20/16 1132 8     Pain Loc --      Pain Edu? --      Excl. in GC? --    No data found.   Updated Vital Signs BP 119/72 (BP Location: Left Arm)   Pulse 98   Temp 99 F (37.2 C) (Oral)   Resp 18   Ht 5\' 2"  (1.575 m)   Wt 150 lb (68 kg)   LMP 07/30/2016   SpO2 100%   BMI 27.44 kg/m   Visual Acuity Right Eye Distance:   Left Eye Distance:   Bilateral Distance:    Right Eye Near:   Left Eye Near:    Bilateral Near:     Physical Exam  Constitutional: She is oriented to person, place, and time. She appears well-developed and well-nourished.  HENT:  Head: Normocephalic and atraumatic.  Right Ear: Hearing, tympanic membrane, external ear and ear canal normal.  Left Ear: Hearing, tympanic membrane, external ear and ear canal normal.  Nose: Mucosal edema present. Right sinus exhibits maxillary sinus tenderness and frontal sinus tenderness. Left sinus exhibits maxillary sinus tenderness and frontal sinus tenderness.  Mouth/Throat: Uvula is midline and oropharynx is clear and moist. No posterior oropharyngeal edema.  Eyes: Pupils are equal, round, and reactive to light.  Neck: Normal range of motion. Neck supple.  Cardiovascular: Normal rate.   Pulmonary/Chest: Effort normal and breath sounds normal.  Lymphadenopathy:    She has no cervical adenopathy.  Neurological: She is alert and oriented to person, place, and time.  Skin: Skin is warm.  Psychiatric: She has a normal mood and affect.  Vitals reviewed.    UC Treatments / Results  Labs (all labs ordered are listed, but only abnormal results are displayed) Labs Reviewed - No data to display  EKG  EKG Interpretation None       Radiology No results found.  Procedures Procedures (including critical care time)  Medications Ordered in UC Medications - No data to display   Initial Impression / Assessment and Plan / UC Course  I have reviewed the triage  vital signs and the nursing notes.  Pertinent labs & imaging results that were available during my care of the patient were reviewed by me and considered in my medical decision making (see chart for details).    percent patient how to use a steroid nasal spray hopefully she will tolerate them now and gave a prescription for Flonase. Since is also component of shortness of breath we'll place on Advair discus 250/50 one puff twice a day and lastly I'm going to recommend Zantac 150 twice a day H2-blocker to take with the Claritin and Zyrtec  follow-up PCP in 2 weeks not better work note given for today and tomorrow as well    Final Clinical Impressions(s) / UC Diagnoses   Final diagnoses:  Asthma due to environmental allergies  Environmental and seasonal allergies    New Prescriptions New Prescriptions   FLUTICASONE (FLONASE) 50 MCG/ACT NASAL SPRAY    Place 2 sprays into both nostrils daily.   FLUTICASONE-SALMETEROL (ADVAIR DISKUS) 250-50 MCG/DOSE AEPB    Inhale 1 puff into the lungs 2 (two) times daily.   RANITIDINE (ZANTAC) 150 MG CAPSULE    Take 1 capsule (150 mg total) by mouth 2 (two) times daily.     Note: This dictation was prepared with Dragon dictation along with smaller phrase technology. Any transcriptional errors that result from this process are unintentional.   Hassan Rowan, MD 08/20/16 1249

## 2016-08-31 ENCOUNTER — Ambulatory Visit
Admission: EM | Admit: 2016-08-31 | Discharge: 2016-08-31 | Disposition: A | Discharge status: Home / Self Care | Payer: Federal, State, Local not specified - PPO | Attending: Family Medicine | Admitting: Family Medicine

## 2016-08-31 ENCOUNTER — Encounter: Payer: Self-pay | Admitting: *Deleted

## 2016-08-31 DIAGNOSIS — R05 Cough: Secondary | ICD-10-CM | POA: Diagnosis not present

## 2016-08-31 DIAGNOSIS — J4520 Mild intermittent asthma, uncomplicated: Secondary | ICD-10-CM

## 2016-08-31 DIAGNOSIS — J45909 Unspecified asthma, uncomplicated: Secondary | ICD-10-CM

## 2016-08-31 DIAGNOSIS — R490 Dysphonia: Secondary | ICD-10-CM

## 2016-08-31 MED ORDER — BENZONATATE 100 MG PO CAPS: 100.0000 mg | ORAL_CAPSULE | Freq: Three times a day (TID) | ORAL | 0 refills | Status: DC

## 2016-08-31 MED ORDER — IPRATROPIUM-ALBUTEROL 0.5-2.5 (3) MG/3ML IN SOLN
3.0000 mL | Freq: Once | RESPIRATORY_TRACT | Status: AC
  Administered 2016-08-31: 3 mL via RESPIRATORY_TRACT

## 2016-08-31 MED ORDER — MONTELUKAST SODIUM 10 MG PO TABS: 10.0000 mg | ORAL_TABLET | Freq: Every day | ORAL | 0 refills | Status: DC

## 2016-08-31 NOTE — ED Provider Notes (Signed)
CSN: [id number]     Arrival date & time 08/31/16  1405 History   First MD Initiated Contact with Patient 08/31/16 1442     Chief Complaint  Patient presents with  . Cough  . Hoarse   (Consider location/radiation/quality/duration/timing/severity/associated sxs/prior Treatment) 21 yo F seen last week with diagnosis asthma...using her Advair and albuterol as orderd. Taking Claritin and requesting refill on montelucast, Started Zantac but didn't understand Rx and stopped again  Using Flonase but "doesn't like the taste"- taking Claritin daily Now anxious because experiencing hoarseness and some mucous in cough in early morning Having cough No fever  No malaise  Denies GERD awareness daytime Leaving on 2 week vacation and concerned about having correct medications/refills Primary Care with [facility]       Past Medical History:  Diagnosis Date  . Asthma    Past Surgical History:  Procedure Laterality Date  . NO PAST SURGERIES     History reviewed. No pertinent family history. Social History  Substance Use Topics  . Smoking status: Never Smoker  . Smokeless tobacco: Never Used  . Alcohol use No   OB History    No data available     Review of Systems  Constitutional: Negative.   HENT: Positive for voice change.   Eyes: Negative.   Respiratory: Positive for cough. Negative for wheezing.   Cardiovascular: Negative.   Gastrointestinal: Negative.   Endocrine: Negative.   Genitourinary: Negative.   Musculoskeletal: Negative.   Allergic/Immunologic: Positive for environmental allergies.  Neurological: Negative.   Hematological: Negative.   Psychiatric/Behavioral: Negative.     Allergies  Penicillins  Home Medications   Prior to Admission medications   Medication Sig Start Date End Date Taking? Authorizing Provider  albuterol (PROVENTIL HFA;VENTOLIN HFA) 108 (90 Base) MCG/ACT inhaler Inhale into the lungs every 6 (six) hours as needed for wheezing or shortness of  breath.   Yes [provider]  beclomethasone (QVAR) 80 MCG/ACT inhaler Inhale 2 puffs into the lungs as needed.   Yes [provider]  fexofenadine (ALLEGRA) 180 MG tablet Take 180 mg by mouth daily.   Yes [provider]  fluticasone (FLONASE) 50 MCG/ACT nasal spray Place 1 spray into both nostrils 2 (two) times daily. 08/23/15  Yes Betancourt, Jarold Song, NP  fluticasone (FLONASE) 50 MCG/ACT nasal spray Place 2 sprays into both nostrils daily. 08/20/16  Yes Hassan Rowan, MD  ranitidine (ZANTAC) 150 MG capsule Take 1 capsule (150 mg total) by mouth 2 (two) times daily. 08/20/16  Yes Hassan Rowan, MD  acetaminophen (TYLENOL) 500 MG tablet Take 2 tablets (1,000 mg total) by mouth every 6 (six) hours as needed for moderate pain. 11/09/14   Betancourt, Jarold Song, NP  benzonatate (TESSALON) 100 MG capsule Take 1 capsule (100 mg total) by mouth every 8 (eight) hours. 08/31/16   Rae Halsted, PA-C  fluconazole (DIFLUCAN) 150 MG tablet Take 1 tablet (150 mg total) by mouth daily. 10/30/15   Jolene Provost, MD  Fluticasone-Salmeterol (ADVAIR DISKUS) 250-50 MCG/DOSE AEPB Inhale 1 puff into the lungs 2 (two) times daily. 08/20/16   Hassan Rowan, MD  montelukast (SINGULAIR) 10 MG tablet Take 1 tablet (10 mg total) by mouth at bedtime. 08/31/16   Rae Halsted, PA-C  sodium chloride (OCEAN) 0.65 % SOLN nasal spray Place 2 sprays into both nostrils every 2 (two) hours while awake. 08/23/15 09/09/15  Betancourt, Jarold Song, NP   Meds Ordered and Administered this Visit   Medications  ipratropium-albuterol (DUONEB)  0.5-2.5 (3) MG/3ML nebulizer solution 3 mL (3 mLs Nebulization Given 08/31/16 1458)    BP 108/76 (BP Location: Left Arm)   Pulse 79   Temp 98.8 F (37.1 C) (Oral)   Resp 16   Ht 5\' 2"  (1.575 m)   Wt 150 lb (68 kg)   LMP 08/24/2016 (Approximate)   SpO2 100%   BMI 27.44 kg/m  No data found.   Physical Exam  Constitutional: She is oriented to person, place, and time. She appears  well-developed and well-nourished.  HENT:  Head: Normocephalic and atraumatic.  Eyes: Pupils are equal, round, and reactive to light. EOM are normal.  Neck: Normal range of motion. Neck supple.  Cardiovascular: Normal rate and regular rhythm.   Pulmonary/Chest: Effort normal and breath sounds normal. She has no wheezes.  Non productive cough, mild  Abdominal: Soft. Bowel sounds are normal.  Musculoskeletal: Normal range of motion.  Neurological: She is alert and oriented to person, place, and time.  Skin: Skin is warm and dry.  Psychiatric: She has a normal mood and affect.    Urgent Care Course     Procedures (including critical care time)  Duo-Neb   Well tolerated , feels better, cough resolved  MDM   1. Mild asthma without complication, unspecified whether persistent    Plan:  diagnosis reviewed with patient/   Will refill her montelukast at patient request - leaving on vacation Rx Benzonatate requested for cough--discussed careful effective use of inhalers And medications Re try Zantac 150 mg HS as previously ordered. Follow up with PCP  Rx as per orders;  benefits, risks, potential side effects reviewed   Recommend supportive treatment with cyclic tylenol and ibuprofen Seek additional medical care if symptoms worsen or are not improving    Rae HalstedLee, Rhylan Gross W, PA-C 08/31/16 1601

## 2016-08-31 NOTE — Discharge Instructions (Signed)
°  Will refill her montelukast at patient request - leavig on vacation Rx Benzonatate  For cough--discussed careful effective use of inhalers Re try Zantac 150 mg HS as previously ordered. Follow up with PCP

## 2016-08-31 NOTE — ED Triage Notes (Signed)
Pt seen here last week with URI type symptoms which have since developed into a productive cough- green, and hoarseness. Pt feels worse now.

## 2016-09-07 NOTE — Telephone Encounter (Signed)
Not seen

## 2016-10-25 IMAGING — CR DG TOE 5TH 2+V*L*
3 series · 3 of 3 positions shown · non-contrast
Comparison: None.

CLINICAL DATA: Laceration to the left fifth toe

EXAM:
DG TOE 5TH LEFT

[toe ap]
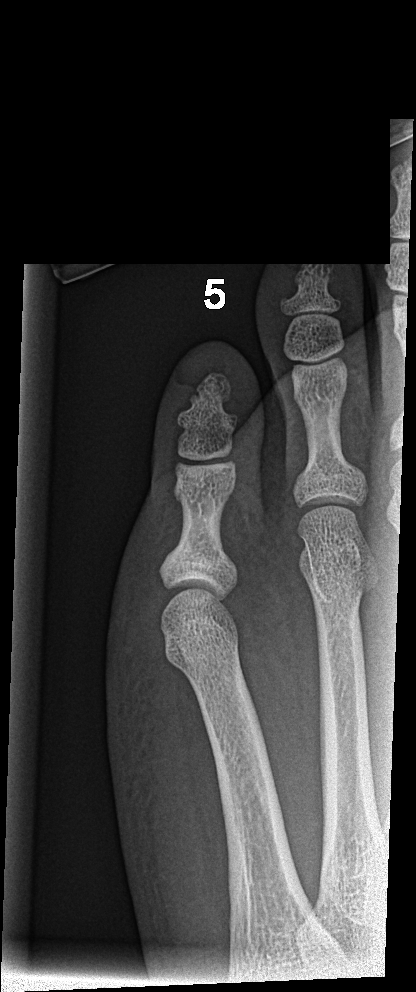

[toe obl]
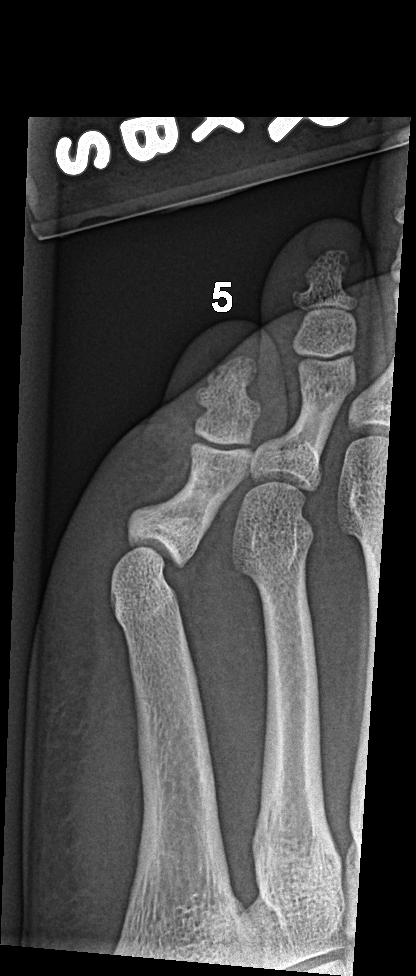

[toe lat]
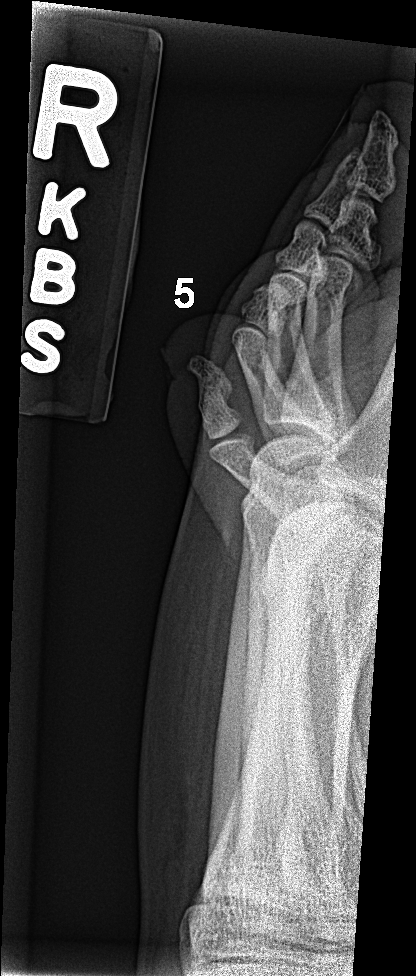

[3 of 3 positions shown; findings below may reference images not displayed]

FINDINGS: There is no evidence of fracture or dislocation. There is no
evidence of arthropathy or other focal bone abnormality. Soft
tissues are unremarkable. No radiopaque foreign body.
IMPRESSION: Negative.

## 2016-12-01 ENCOUNTER — Ambulatory Visit
Admission: EM | Admit: 2016-12-01 | Discharge: 2016-12-01 | Disposition: A | Discharge status: Home / Self Care | Payer: Federal, State, Local not specified - PPO | Attending: Family Medicine | Admitting: Family Medicine

## 2016-12-01 ENCOUNTER — Encounter: Payer: Self-pay | Admitting: Emergency Medicine

## 2016-12-01 DIAGNOSIS — L509 Urticaria, unspecified: Secondary | ICD-10-CM | POA: Diagnosis not present

## 2016-12-01 DIAGNOSIS — L232 Allergic contact dermatitis due to cosmetics: Secondary | ICD-10-CM

## 2016-12-01 MED ORDER — METHYLPREDNISOLONE 4 MG PO TBPK: ORAL_TABLET | ORAL | 0 refills | Status: DC

## 2016-12-01 NOTE — ED Provider Notes (Signed)
[facility]    CSN: [id number] Arrival date & time: 12/01/16  1059     History   Chief Complaint Chief Complaint  Patient presents with  . Pruritis  . Rash    HPI Jordan Solis is a 21 y.o. female.   HPI  Is a 21 year old female who complains of itching rash with burning and swelling with a small amount of hives that she noticed 2 days ago after applying new makeup powder. She is been taken Benadryl at nighttime. She states that it is improving over time but still present. She also has had a acne type breakout over her forehead which is not usually present in this position. He has since stopped using the powder. In addition she has noticed some increase in her asthma and has been using her inhalers more frequently than usual.          Past Medical History:  Diagnosis Date  . Asthma     There are no active problems to display for this patient.   Past Surgical History:  Procedure Laterality Date  . NO PAST SURGERIES      OB History    No data available       Home Medications    Prior to Admission medications   Medication Sig Start Date End Date Taking? Authorizing Provider  acetaminophen (TYLENOL) 500 MG tablet Take 2 tablets (1,000 mg total) by mouth every 6 (six) hours as needed for moderate pain. 11/09/14   Betancourt, Jarold Song, NP  albuterol (PROVENTIL HFA;VENTOLIN HFA) 108 (90 Base) MCG/ACT inhaler Inhale into the lungs every 6 (six) hours as needed for wheezing or shortness of breath.    [provider]  fexofenadine (ALLEGRA) 180 MG tablet Take 180 mg by mouth daily.    [provider]  fluticasone (FLONASE) 50 MCG/ACT nasal spray Place 2 sprays into both nostrils daily. 08/20/16   Hassan Rowan, MD  methylPREDNISolone (MEDROL DOSEPAK) 4 MG TBPK tablet Take per package instructions 12/01/16   Ovid Curd P, PA-C  montelukast (SINGULAIR) 10 MG tablet Take 1 tablet (10 mg total) by mouth at bedtime. 08/31/16   Rae Halsted, PA-C    Family History History reviewed. No pertinent family history.  Social History Social History  Substance Use Topics  . Smoking status: Never Smoker  . Smokeless tobacco: Never Used  . Alcohol use No     Allergies   Penicillins   Review of Systems Review of Systems  Constitutional: Positive for activity change. Negative for appetite change, chills, fatigue and fever.  Respiratory: Positive for shortness of breath. Negative for wheezing.   All other systems reviewed and are negative.    Physical Exam Triage Vital Signs ED Triage Vitals  Enc Vitals Group     BP 12/01/16 1113 112/75     Pulse Rate 12/01/16 1113 79     Resp 12/01/16 1113 14     Temp 12/01/16 1113 98.4 F (36.9 C)     Temp Source 12/01/16 1113 Oral     SpO2 12/01/16 1113 100 %     Weight 12/01/16 1110 175 lb 12.8 oz (79.7 kg)     Height 12/01/16 1110 5\' 3"  (1.6 m)     Head Circumference --      Peak Flow --      Pain Score 12/01/16 1110 0     Pain Loc --      Pain Edu? --      Excl. in  GC? --    No data found.   Updated Vital Signs BP 112/75 (BP Location: Left Arm)   Pulse 79   Temp 98.4 F (36.9 C) (Oral)   Resp 14   Ht 5\' 3"  (1.6 m)   Wt 175 lb 12.8 oz (79.7 kg)   LMP 11/24/2016 (Approximate)   SpO2 100%   BMI 31.14 kg/m   Visual Acuity Right Eye Distance:   Left Eye Distance:   Bilateral Distance:    Right Eye Near:   Left Eye Near:    Bilateral Near:     Physical Exam  Constitutional: She is oriented to person, place, and time. She appears well-developed and well-nourished. No distress.  HENT:  Head: Normocephalic.  Eyes: Pupils are equal, round, and reactive to light. Right eye exhibits no discharge. Left eye exhibits no discharge.  Neck: Normal range of motion.  Pulmonary/Chest: Effort normal and breath sounds normal.  Musculoskeletal: Normal range of motion.  Neurological: She is alert and oriented to person, place, and time.  Skin: Skin is warm and dry. Rash  noted. She is not diaphoretic.  Refer to photographs for details  Psychiatric: She has a normal mood and affect. Her behavior is normal. Judgment and thought content normal.  Nursing note and vitals reviewed.          UC Treatments / Results  Labs (all labs ordered are listed, but only abnormal results are displayed) Labs Reviewed - No data to display  EKG  EKG Interpretation None       Radiology No results found.  Procedures Procedures (including critical care time)  Medications Ordered in UC Medications - No data to display   Initial Impression / Assessment and Plan / UC Course  I have reviewed the triage vital signs and the nursing notes.  Pertinent labs & imaging results that were available during my care of the patient were reviewed by me and considered in my medical decision making (see chart for details).    Plan: 1. Test/x-ray results and diagnosis reviewed with patient 2. rx as per orders; risks, benefits, potential side effects reviewed with patient 3. Recommend supportive treatment with   use of the prednisone taper. He has already stopped using the makeup and will not use it again. Cause of the urticaria type reaction we will place her on an H2 blocker and H1 blocker for 3-5 days. If she is not improving I have given her the name of a dermatologist that she can see for further evaluation. 4. F/u prn if symptoms worsen or don't improve   Final Clinical Impressions(s) / UC Diagnoses   Final diagnoses:  Allergic contact dermatitis due to cosmetics  Urticaria    New Prescriptions Discharge Medication List as of 12/01/2016 12:13 PM    START taking these medications   Details  methylPREDNISolone (MEDROL DOSEPAK) 4 MG TBPK tablet Take per package instructions, Normal         Controlled Substance Prescriptions [place] Controlled Substance Registry consulted? Not Applicable   Lutricia FeilRoemer, Merle Whitehorn P, PA-C 12/01/16 1530

## 2016-12-01 NOTE — Discharge Instructions (Signed)
Take Zantac 150 mg twice daily for 3 days. Use Zyrtec daily as normal take up to 2 times per day if itching is severe. Benadryl 25-50 mg at bedtime if you having some itching as well. Take the Medrol Dosepak as directed on the package. If you are not improving I have given you a name of a dermatologist to follow-up with.

## 2016-12-01 NOTE — ED Triage Notes (Signed)
Patient c/o itching and rash on her face that started 2 days ago.  Patient states that she used a new make-up powder on her face 2 days ago.

## 2017-05-04 ENCOUNTER — Encounter: Payer: Self-pay | Admitting: *Deleted

## 2017-05-04 ENCOUNTER — Ambulatory Visit
Admission: EM | Admit: 2017-05-04 | Discharge: 2017-05-04 | Disposition: A | Discharge status: Home / Self Care | Payer: Federal, State, Local not specified - PPO | Attending: Emergency Medicine | Admitting: Emergency Medicine

## 2017-05-04 DIAGNOSIS — J4521 Mild intermittent asthma with (acute) exacerbation: Secondary | ICD-10-CM | POA: Diagnosis not present

## 2017-05-04 DIAGNOSIS — J040 Acute laryngitis: Secondary | ICD-10-CM

## 2017-05-04 DIAGNOSIS — R05 Cough: Secondary | ICD-10-CM

## 2017-05-04 DIAGNOSIS — R058 Other specified cough: Secondary | ICD-10-CM

## 2017-05-04 MED ORDER — PREDNISONE 50 MG PO TABS: 50.0000 mg | ORAL_TABLET | Freq: Every day | ORAL | 0 refills | Status: AC

## 2017-05-04 MED ORDER — FLUTICASONE PROPIONATE 50 MCG/ACT NA SUSP: 2.0000 | Freq: Every day | NASAL | 0 refills | Status: DC

## 2017-05-04 MED ORDER — AEROCHAMBER PLUS MISC: 2 refills | Status: AC

## 2017-05-04 MED ORDER — ALBUTEROL SULFATE HFA 108 (90 BASE) MCG/ACT IN AERS: 2.0000 | INHALATION_SPRAY | RESPIRATORY_TRACT | 0 refills | Status: DC | PRN

## 2017-05-04 MED ORDER — HYDROCOD POLST-CPM POLST ER 10-8 MG/5ML PO SUER: 5.0000 mL | Freq: Two times a day (BID) | ORAL | 0 refills | Status: DC | PRN

## 2017-05-04 NOTE — ED Triage Notes (Signed)
Pt states URI type symptoms 1 week ago which resolved but a non-productive cough persists since then. Gradual onset hoarseness 3-4 days ago which is worse today.

## 2017-05-04 NOTE — ED Provider Notes (Signed)
HPI  SUBJECTIVE:  Jordan Solis is a 22 y.o. female who presents with persistent cough for the past 4-5 days after recovering from a URI last week and laryngitis starting yesterday.  She states that the URI got better with the chills, nasal congestion, rhinorrhea resolving but that the cough is lingering.  She reports wheezing, dyspnea on exertion, states that she is waking up at night coughing.  She reports chest congestion.  No fevers, chest pain, shortness of breath, postnasal drip, sinus pain or pressure.  She also reports losing her voice yesterday.  She denies sore throat, sensation of her throat swelling shut, drooling, trismus, GERD symptoms.  She tried Mucinex, vitamin C which have helped.  She states that warm fluids also help.  Symptoms are worse with drinking cold fluids. She has a past medical history of asthma and has an albuterol inhaler at home.  She does not have a spacer.  She has not used her albuterol inhaler while she has been sick.  Also history of sinusitis.  No history of diabetes, hypertension, GERD.  LMP: 3/10.  Denies the possibility of being pregnant.  PMD: Dr. Greggory Stallion at [facility] primary care.    Past Medical History:  Diagnosis Date  . Asthma     Past Surgical History:  Procedure Laterality Date  . NO PAST SURGERIES      Family History  Problem Relation Age of Onset  . Healthy Mother   . Healthy Father     Social History   Tobacco Use  . Smoking status: Never Smoker  . Smokeless tobacco: Never Used  Substance Use Topics  . Alcohol use: No  . Drug use: No    No current facility-administered medications for this encounter.   Current Outpatient Medications:  .  acetaminophen (TYLENOL) 500 MG tablet, Take 2 tablets (1,000 mg total) by mouth every 6 (six) hours as needed for moderate pain., Disp: 60 tablet, Rfl: 0 .  albuterol (PROVENTIL HFA;VENTOLIN HFA) 108 (90 Base) MCG/ACT inhaler, Inhale 2 puffs into the lungs every 4 (four) hours as needed for  wheezing or shortness of breath., Disp: 1 Inhaler, Rfl: 0 .  chlorpheniramine-HYDROcodone (TUSSIONEX PENNKINETIC ER) 10-8 MG/5ML SUER, Take 5 mLs by mouth every 12 (twelve) hours as needed for cough., Disp: 120 mL, Rfl: 0 .  fluticasone (FLONASE) 50 MCG/ACT nasal spray, Place 2 sprays into both nostrils daily., Disp: 16 g, Rfl: 0 .  montelukast (SINGULAIR) 10 MG tablet, Take 1 tablet (10 mg total) by mouth at bedtime., Disp: 30 tablet, Rfl: 0 .  predniSONE (DELTASONE) 50 MG tablet, Take 1 tablet (50 mg total) by mouth daily with breakfast for 5 days., Disp: 5 tablet, Rfl: 0 .  Spacer/Aero-Holding Chambers (AEROCHAMBER PLUS) inhaler, Use as instructed, Disp: 1 each, Rfl: 2  Allergies  Allergen Reactions  . Penicillins Anaphylaxis     ROS  As noted in HPI.   Physical Exam  BP 122/68 (BP Location: Left Arm)   Pulse 84   Temp 98.1 F (36.7 C) (Oral)   Resp 16   Ht 5\' 2"  (1.575 m)   Wt 180 lb (81.6 kg)   SpO2 100%   BMI 32.92 kg/m   Constitutional: Well developed, well nourished, no acute distress Eyes:  EOMI, conjunctiva normal bilaterally HENT: Normocephalic, atraumatic,mucus membranes moist.  Mucoid nasal congestion, erythematous, swollen turbinates.  No sinus tenderness.  Normal tonsils, uvula midline.  Positive cobblestoning.  No obvious postnasal drip. Speaking in a whisper. No muffled, hot potato  voice. Respiratory: Normal inspiratory effort, lungs clear bilaterally, fair air movement.  No chest wall tenderness Cardiovascular: Normal rate regular rhythm, no murmurs, rubs, gallops GI: nondistended skin: No rash, skin intact Musculoskeletal: no deformities Neurologic: Alert & oriented x 3, no focal neuro deficits Psychiatric: Speech and behavior appropriate   ED Course   Medications - No data to display  No orders of the defined types were placed in this encounter.   No results found for this or any previous visit (from the past 24 hour(s)). No results found.  ED  Clinical Impression  Post-viral cough syndrome  Mild intermittent asthma with acute exacerbation   ED Assessment/Plan  [place] Narcotic database reviewed for this patient, and feel that the risk/benefit ratio today is favorable for proceeding with a prescription for controlled substance.  No opiate prescriptions in the past 2 years.  Presentation consistent with a post viral cough syndrome with perhaps a mild asthma exacerbation, and laryngitis.  Doubt epiglottitis or impending airway obstruction.  Will send home with 2 puffs from her albuterol inhaler using a spacer every 4-6 hours, Flonase, saline nasal irrigation with a Lloyd HugerNeil med sinus rinse, Mucinex D, prednisone 50 mg for 5 days, Tussionex for the cough at night.  Follow-up with PMD as needed, to the ER if she gets worse.  Discussed  MDM, plan and followup with patient. Discussed sn/sx that should prompt return to the ED. patient agrees with plan.   Meds ordered this encounter  Medications  . albuterol (PROVENTIL HFA;VENTOLIN HFA) 108 (90 Base) MCG/ACT inhaler    Sig: Inhale 2 puffs into the lungs every 4 (four) hours as needed for wheezing or shortness of breath.    Dispense:  1 Inhaler    Refill:  0  . fluticasone (FLONASE) 50 MCG/ACT nasal spray    Sig: Place 2 sprays into both nostrils daily.    Dispense:  16 g    Refill:  0  . Spacer/Aero-Holding Chambers (AEROCHAMBER PLUS) inhaler    Sig: Use as instructed    Dispense:  1 each    Refill:  2  . chlorpheniramine-HYDROcodone (TUSSIONEX PENNKINETIC ER) 10-8 MG/5ML SUER    Sig: Take 5 mLs by mouth every 12 (twelve) hours as needed for cough.    Dispense:  120 mL    Refill:  0  . predniSONE (DELTASONE) 50 MG tablet    Sig: Take 1 tablet (50 mg total) by mouth daily with breakfast for 5 days.    Dispense:  5 tablet    Refill:  0    *This clinic note was created using Scientist, clinical (histocompatibility and immunogenetics)Dragon dictation software. Therefore, there may be occasional mistakes despite careful proofreading.   ?    Domenick GongMortenson, Adriene Knipfer, MD 05/04/17 1740

## 2017-05-04 NOTE — Discharge Instructions (Addendum)
2 puffs from your albuterol inhaler using a spacer every 4-6 hours, Flonase, saline nasal irrigation with a Lloyd HugerNeil med sinus rinse, Mucinex D, prednisone 50 mg for 5 days, Tussionex for the cough at night.  Follow-up with your care physician as needed especially if you are not getting better in about 5 days, and go to the ER if you get worse.

## 2017-07-04 ENCOUNTER — Other Ambulatory Visit: Payer: Self-pay

## 2017-07-04 ENCOUNTER — Encounter: Payer: Self-pay | Admitting: Gynecology

## 2017-07-04 ENCOUNTER — Ambulatory Visit
Admission: EM | Admit: 2017-07-04 | Discharge: 2017-07-04 | Disposition: A | Discharge status: Home / Self Care | Payer: Federal, State, Local not specified - PPO | Attending: Family Medicine | Admitting: Family Medicine

## 2017-07-04 DIAGNOSIS — J309 Allergic rhinitis, unspecified: Secondary | ICD-10-CM

## 2017-07-04 DIAGNOSIS — J4521 Mild intermittent asthma with (acute) exacerbation: Secondary | ICD-10-CM

## 2017-07-04 DIAGNOSIS — J45901 Unspecified asthma with (acute) exacerbation: Secondary | ICD-10-CM

## 2017-07-04 MED ORDER — ALBUTEROL SULFATE HFA 108 (90 BASE) MCG/ACT IN AERS: 2.0000 | INHALATION_SPRAY | RESPIRATORY_TRACT | 0 refills | Status: DC | PRN

## 2017-07-04 MED ORDER — PREDNISONE 20 MG PO TABS: 40.0000 mg | ORAL_TABLET | Freq: Every day | ORAL | 0 refills | Status: AC

## 2017-07-04 NOTE — ED Triage Notes (Signed)
Per patient had sinus problem/ allergies. Pt. Stated had an asthma attack with her allergy / per patient cannot t find her asthma medication.

## 2017-07-04 NOTE — ED Provider Notes (Signed)
[facility] ____________________________________________  Time seen: Approximately 3:48 PM  I have reviewed the triage vital signs and the nursing notes.   HISTORY  Chief Complaint Sinus Problem   HPI Jordan Solis is a 22 y.o. female presented for evaluation of asthma complaints.  Patient states that yesterday she forgot to take her allergy medicine on time, and reports that afternoon she had a lot of sneezing back to back.  States repetitive sneezing runny nose then led into asthma, stating she began to wheeze.  States that she could not find her albuterol inhaler.  States she then had to call EMS for albuterol treatment which helped.  States that she did feel better after the breathing treatment, and presenting today for request of albuterol refill.  States did take her allergy medicine today which further helped symptoms.  Denies any recent fevers, increased cough or congestion compared to her normal allergy symptoms.  Denies sinus pain.  Reports continues to eat and drink well.  States some tightness in her chest consistent with her normal allergies, denies otherwise chest pain or shortness of breath. Denies chest pain, shortness of breath, abdominal pain,  extremity swelling or rash. Denies recent sickness. Denies recent antibiotic use.   [place]: PCP Patient's last menstrual period was 06/04/2017.Denies pregnancy.   Past Medical History:  Diagnosis Date  . Asthma     There are no active problems to display for this patient.   Past Surgical History:  Procedure Laterality Date  . NO PAST SURGERIES       No current facility-administered medications for this encounter.   Current Outpatient Medications:  .  albuterol (PROVENTIL HFA;VENTOLIN HFA) 108 (90 Base) MCG/ACT inhaler, Inhale 2 puffs into the lungs every 4 (four) hours as needed for wheezing or shortness of breath., Disp: 1 Inhaler, Rfl: 0 .  cetirizine HCl (ZYRTEC) 1 MG/ML solution,  Take by mouth daily., Disp: , Rfl:  .  montelukast (SINGULAIR) 10 MG tablet, Take by mouth., Disp: , Rfl:  .  Spacer/Aero-Holding Chambers (AEROCHAMBER PLUS) inhaler, Use as instructed, Disp: 1 each, Rfl: 2 .  albuterol (PROVENTIL HFA;VENTOLIN HFA) 108 (90 Base) MCG/ACT inhaler, Inhale 2 puffs into the lungs every 4 (four) hours as needed for wheezing or shortness of breath., Disp: 1 Inhaler, Rfl: 0 .  predniSONE (DELTASONE) 20 MG tablet, Take 2 tablets (40 mg total) by mouth daily for 3 days., Disp: 6 tablet, Rfl: 0  Allergies Penicillins  Family History  Problem Relation Age of Onset  . Healthy Mother   . Healthy Father     Social History Social History   Tobacco Use  . Smoking status: Never Smoker  . Smokeless tobacco: Never Used  Substance Use Topics  . Alcohol use: No  . Drug use: No    Review of Systems Constitutional: No fever ENT: No sore throat. Cardiovascular: Denies chest pain. Respiratory: Denies shortness of breath. Gastrointestinal: No abdominal pain.   Musculoskeletal: Negative for back pain. Skin: Negative for rash.   ____________________________________________   PHYSICAL EXAM:  VITAL SIGNS: ED Triage Vitals  Enc Vitals Group     BP 07/04/17 1441 118/81     Pulse Rate 07/04/17 1441 76     Resp 07/04/17 1441 16     Temp 07/04/17 1441 98.9 F (37.2 C)     Temp Source 07/04/17 1441 Oral     SpO2 07/04/17 1441 100 %     Weight 07/04/17 1443 170 lb (77.1 kg)  Height 07/04/17 1443  (1.575 m)     Head Circumference --      Peak Flow --      Pain Score 07/04/17 1443 5     Pain Loc --      Pain Edu? --      Excl. in GC? --    Constitutional: Alert and oriented. Well appearing and in no acute distress. Eyes: Conjunctivae are normal. PERRL. EOMI. Head: Atraumatic.  Mild bilateral frontal and maxillary sinus tenderness palpation.  No swelling. No erythema.  Ears: no erythema, normal TMs bilaterally.   Nose:Nasal congestion   Mouth/Throat:  Mucous membranes are moist. No pharyngeal erythema. No tonsillar swelling or exudate.  Neck: No stridor.  No cervical spine tenderness to palpation. Hematological/Lymphatic/Immunilogical: No cervical lymphadenopathy. Cardiovascular: Normal rate, regular rhythm. Grossly normal heart sounds.  Good peripheral circulation. Respiratory: Normal respiratory effort.  No retractions. No rales or rhonchi.  Minimal scattered wheezing.  Occasional dry cough noted in room.  Good air movement.  Musculoskeletal: Ambulatory with steady gait. No cervical, thoracic or lumbar tenderness to palpation. Neurologic:  Normal speech and language. No gait instability. Skin:  Skin appears warm, dry and intact. No rash noted. Psychiatric: Mood and affect are normal. Speech and behavior are normal.  ___________________________________________   LABS (all labs ordered are listed, but only abnormal results are displayed)  Labs Reviewed - No data to display   PROCEDURES Procedures    INITIAL IMPRESSION / ASSESSMENT AND PLAN / ED COURSE  Pertinent labs & imaging results that were available during my care of the patient were reviewed by me and considered in my medical decision making (see chart for details).  Well-appearing patient.  No acute distress.  Suspect allergic rhinitis with mild asthma exacerbation.  Will refill albuterol inhaler and 3-day course of prednisone.  Encourage supportive care and allergy medicine.Discussed indication, risks and benefits of medications with patient.  Discussed follow up with Primary care physician this week. Discussed follow up and return parameters including no resolution or any worsening concerns. Patient verbalized understanding and agreed to plan.   ____________________________________________   FINAL CLINICAL IMPRESSION(S) / ED DIAGNOSES  Final diagnoses:  Allergic rhinitis, unspecified seasonality, unspecified trigger  Mild asthma with acute exacerbation, unspecified  whether persistent     ED Discharge Orders        Ordered    predniSONE (DELTASONE) 20 MG tablet  Daily     07/04/17 1522    albuterol (PROVENTIL HFA;VENTOLIN HFA) 108 (90 Base) MCG/ACT inhaler  Every 4 hours PRN     07/04/17 1522       Note: This dictation was prepared with Dragon dictation along with smaller phrase technology. Any transcriptional errors that result from this process are unintentional.         Renford Dills, NP 07/04/17 1654

## 2017-07-04 NOTE — Discharge Instructions (Addendum)
Take medication as prescribed. Rest. Drink plenty of fluids.  ° °Follow up with your primary care physician this week as needed. Return to Urgent care for new or worsening concerns.  ° °

## 2018-01-04 DIAGNOSIS — J309 Allergic rhinitis, unspecified: Secondary | ICD-10-CM | POA: Insufficient documentation

## 2018-01-04 DIAGNOSIS — G43909 Migraine, unspecified, not intractable, without status migrainosus: Secondary | ICD-10-CM | POA: Insufficient documentation

## 2018-02-17 ENCOUNTER — Other Ambulatory Visit: Payer: Self-pay

## 2018-02-17 ENCOUNTER — Ambulatory Visit
Admission: EM | Admit: 2018-02-17 | Discharge: 2018-02-17 | Disposition: A | Discharge status: Home / Self Care | Payer: Federal, State, Local not specified - PPO | Attending: Emergency Medicine | Admitting: Emergency Medicine

## 2018-02-17 ENCOUNTER — Encounter: Payer: Self-pay | Admitting: Emergency Medicine

## 2018-02-17 DIAGNOSIS — L299 Pruritus, unspecified: Secondary | ICD-10-CM | POA: Diagnosis not present

## 2018-02-17 DIAGNOSIS — T7840XA Allergy, unspecified, initial encounter: Secondary | ICD-10-CM

## 2018-02-17 MED ORDER — HYDROXYZINE HCL 25 MG PO TABS: 25.0000 mg | ORAL_TABLET | Freq: Every day | ORAL | 0 refills | Status: DC

## 2018-02-17 MED ORDER — PREDNISONE 10 MG (21) PO TBPK: ORAL_TABLET | ORAL | 0 refills | Status: DC

## 2018-02-17 NOTE — Discharge Instructions (Addendum)
You can increase your cetirizine to 10 mg twice a day.  You can add the Atarax 25 mg at night.  Finish the steroids, even if you feel better.  Make sure your make-up brushes are clean, and consider discontinuing the prescription facial cream.

## 2018-02-17 NOTE — ED Triage Notes (Signed)
Pt c/o rash on her eye lids. Started last night. She reports that it was worse last night. She reports that this has happened before and has gotten worse, where the rash spreads down her face. She thinks she may have eaten something that was cooked near shell fish last night and she is allergic. 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

## 2018-02-17 NOTE — ED Provider Notes (Signed)
HPI  SUBJECTIVE:  Jordan Solis is a 22 y.o. female who presents with constant itching over her entire face darting last night.  Denies itching elsewhere.  She reports urticaria on her cheeks and eyelids last night, but states that this has resolved. no Urticaria elsewhere.  She states that she ate some food yesterday that was cooked near shellfish, and wonders if the food was contaminated by the shellfish.  She has anaphylaxis to shellfish.  She denies lip, tongue swelling, sensation of throat swelling shut, difficulty breathing, wheezing, abdominal pain, diarrhea, syncope.  She tried cetirizine and Singulair which she takes on a daily basis with some improvement in her symptoms.  Symptoms are worse with scratching.  No new make-up, lotions, soaps, detergents.  She has been on a prescription peroxide face cream for the past 1 to 2 months, states that she keeps it away from her eyes.  No new medications.  No recent antibiotics.  No recent sun exposure.  Last washed her make-up brushes in early December.  She has a past medical history of anaphylaxis to shellfish, urticaria, asthma.  No history of diabetes, hypertension.  LMP: 1/3.  Denies possibility of being pregnant.  PMD: [place]   Past Medical History:  Diagnosis Date  . Asthma     Past Surgical History:  Procedure Laterality Date  . NO PAST SURGERIES      Family History  Problem Relation Age of Onset  . Healthy Mother   . Healthy Father     Social History   Tobacco Use  . Smoking status: Never Smoker  . Smokeless tobacco: Never Used  Substance Use Topics  . Alcohol use: No  . Drug use: No    No current facility-administered medications for this encounter.   Current Outpatient Medications:  .  albuterol (PROVENTIL HFA;VENTOLIN HFA) 108 (90 Base) MCG/ACT inhaler, Inhale 2 puffs into the lungs every 4 (four) hours as needed for wheezing or shortness of breath., Disp: 1 Inhaler, Rfl: 0 .  cetirizine HCl  (ZYRTEC) 1 MG/ML solution, Take by mouth daily., Disp: , Rfl:  .  montelukast (SINGULAIR) 10 MG tablet, Take by mouth., Disp: , Rfl:  .  albuterol (PROVENTIL HFA;VENTOLIN HFA) 108 (90 Base) MCG/ACT inhaler, Inhale 2 puffs into the lungs every 4 (four) hours as needed for wheezing or shortness of breath., Disp: 1 Inhaler, Rfl: 0 .  hydrOXYzine (ATARAX/VISTARIL) 25 MG tablet, Take 1 tablet (25 mg total) by mouth at bedtime., Disp: 20 tablet, Rfl: 0 .  predniSONE (STERAPRED UNI-PAK 21 TAB) 10 MG (21) TBPK tablet, Dispense one 6 day pack. Take as directed with food., Disp: 21 tablet, Rfl: 0 .  Spacer/Aero-Holding Chambers (AEROCHAMBER PLUS) inhaler, Use as instructed, Disp: 1 each, Rfl: 2  Allergies  Allergen Reactions  . Penicillins Anaphylaxis  . Shellfish Allergy Anaphylaxis  . Pork Allergy Rash     ROS  As noted in HPI.   Physical Exam  BP 117/71 (BP Location: Left Arm)   Pulse 85   Temp 98.9 F (37.2 C) (Oral)   Resp 18   Ht 5\' 2"  (1.575 m)   Wt 81.6 kg   LMP 02/11/2018   SpO2 100%   BMI 32.92 kg/m   Constitutional: Well developed, well nourished, no acute distress Eyes:  EOMI, conjunctiva normal bilaterally HENT: Normocephalic, atraumatic,mucus membranes moist.  Mild diffuse facial tenderness.  No excoriations.  No obvious erythema, urticaria. Respiratory: Normal inspiratory effort lungs clear bilaterally, good air movement Cardiovascular:  Normal rate GI: nondistended skin: No rash or urticaria over arms, torso, skin intact Musculoskeletal: no deformities Neurologic: Alert & oriented x 3, no focal neuro deficits Psychiatric: Speech and behavior appropriate   ED Course   Medications - No data to display  No orders of the defined types were placed in this encounter.   No results found for this or any previous visit (from the past 24 hour(s)). No results found.  ED Clinical Impression  Allergic reaction, initial encounter  Itching   ED  Assessment/Plan  No obvious urticaria or anaphylaxis here today, however patient is reporting severe facial itching.  Suspect could be from the prescription facial cream, or contamination from the food she ate yesterday, however she does not have any oral, pulmonary, GI involvement.  We will have her increase her cetirizine to 10 mg twice a day, add Atarax 25 mg at night if the twice daily cetirizine is not working, 6-day prednisone taper.  Follow-up with PMD as needed, and go to the ER any signs of anaphylaxis.  Discussed MDM, treatment plan, and plan for follow-up with patient. Discussed sn/sx that should prompt return to the ED. patient agrees with plan.   Meds ordered this encounter  Medications  . predniSONE (STERAPRED UNI-PAK 21 TAB) 10 MG (21) TBPK tablet    Sig: Dispense one 6 day pack. Take as directed with food.    Dispense:  21 tablet    Refill:  0  . hydrOXYzine (ATARAX/VISTARIL) 25 MG tablet    Sig: Take 1 tablet (25 mg total) by mouth at bedtime.    Dispense:  20 tablet    Refill:  0    *This clinic note was created using Scientist, clinical (histocompatibility and immunogenetics). Therefore, there may be occasional mistakes despite careful proofreading.   ?    Domenick Gong, MD 02/17/18 1028

## 2019-01-18 ENCOUNTER — Ambulatory Visit
Admission: EM | Admit: 2019-01-18 | Discharge: 2019-01-18 | Disposition: A | Discharge status: Home / Self Care | Payer: Federal, State, Local not specified - PPO | Attending: Family Medicine | Admitting: Family Medicine

## 2019-01-18 ENCOUNTER — Other Ambulatory Visit: Payer: Self-pay

## 2019-01-18 DIAGNOSIS — R509 Fever, unspecified: Secondary | ICD-10-CM | POA: Diagnosis not present

## 2019-01-18 DIAGNOSIS — R05 Cough: Secondary | ICD-10-CM

## 2019-01-18 DIAGNOSIS — J069 Acute upper respiratory infection, unspecified: Secondary | ICD-10-CM

## 2019-01-18 DIAGNOSIS — Z7189 Other specified counseling: Secondary | ICD-10-CM

## 2019-01-18 NOTE — ED Provider Notes (Signed)
[facility] ____________________________________________  Time seen: Approximately 3:37 PM  I have reviewed the triage vital signs and the nursing notes.   HISTORY  Chief Complaint Fever   HPI Jordan Solis is a 23 y.o. female past medical history of asthma and allergies presenting for evaluation of 3 days of occasional congestion, cough and intermittent fevers.  States she has had occasional fever, which she reports only at night with T-max 101.  States cough is intermittent, not persistent and nonproductive.  States cough is more mild.  Has not been taking over-the-counter medication for same complaints.  Has continued her normal asthma preventative medications.  Has not needed to use her rescue inhaler.  Continues to eat and drink well.  States occasional scratchy throat, denies current sore throat.  Denies known direct sick contacts, does work around a lot of people.  Denies current chest pain or shortness of breath.  Denies changes in taste or smell, vomiting or diarrhea.  Reports otherwise doing well.  Patient's last menstrual period was 12/30/2018.   [place] : PCP   Past Medical History:  Diagnosis Date  . Asthma     There are no active problems to display for this patient.   Past Surgical History:  Procedure Laterality Date  . NO PAST SURGERIES       No current facility-administered medications for this encounter.   Current Outpatient Medications:  .  albuterol (PROVENTIL HFA;VENTOLIN HFA) 108 (90 Base) MCG/ACT inhaler, Inhale 2 puffs into the lungs every 4 (four) hours as needed for wheezing or shortness of breath., Disp: 1 Inhaler, Rfl: 0 .  albuterol (PROVENTIL HFA;VENTOLIN HFA) 108 (90 Base) MCG/ACT inhaler, Inhale 2 puffs into the lungs every 4 (four) hours as needed for wheezing or shortness of breath., Disp: 1 Inhaler, Rfl: 0 .  BREO ELLIPTA 200-25 MCG/INH AEPB, , Disp: , Rfl:  .  cetirizine HCl (ZYRTEC) 1 MG/ML solution, Take  by mouth daily., Disp: , Rfl:  .  hydrOXYzine (ATARAX/VISTARIL) 25 MG tablet, Take 1 tablet (25 mg total) by mouth at bedtime., Disp: 20 tablet, Rfl: 0 .  montelukast (SINGULAIR) 10 MG tablet, Take by mouth., Disp: , Rfl:  .  Spacer/Aero-Holding Chambers (AEROCHAMBER PLUS) inhaler, Use as instructed, Disp: 1 each, Rfl: 2  Allergies Penicillins, Shellfish allergy, and Pork allergy  Family History  Problem Relation Age of Onset  . Healthy Mother   . Healthy Father     Social History Social History   Tobacco Use  . Smoking status: Never Smoker  . Smokeless tobacco: Never Used  Substance Use Topics  . Alcohol use: No  . Drug use: No    Review of Systems Constitutional: No fever ENT:  As above.  Cardiovascular: Denies chest pain. Respiratory: Denies shortness of breath. Gastrointestinal: No abdominal pain.  No nausea, no vomiting.  No diarrhea.   Musculoskeletal: Negative for back pain. Skin: Negative for rash. ____________________________________________   PHYSICAL EXAM:  VITAL SIGNS: ED Triage Vitals  Enc Vitals Group     BP 01/18/19 1451 115/77     Pulse Rate 01/18/19 1451 94     Resp 01/18/19 1451 18     Temp 01/18/19 1451 98.9 F (37.2 C)     Temp Source 01/18/19 1451 Oral     SpO2 01/18/19 1451 100 %     Weight 01/18/19 1447 190 lb (86.2 kg)     Height 01/18/19 1447 5\' 2"  (1.575 m)     Head Circumference --  Peak Flow --      Pain Score 01/18/19 1447 2     Pain Loc --      Pain Edu? --      Excl. in GC? --    Constitutional: Alert and oriented. Well appearing and in no acute distress. Eyes: Conjunctivae are normal.  Head: Atraumatic. No swelling. No erythema.  Ears: no erythema, normal TMs bilaterally.   Nose:Nasal congestion  Mouth/Throat: Mucous membranes are moist. No pharyngeal erythema. No tonsillar swelling or exudate.  Neck: No stridor.  No cervical spine tenderness to palpation. Hematological/Lymphatic/Immunilogical: No cervical  lymphadenopathy. Cardiovascular: Normal rate, regular rhythm. Grossly normal heart sounds.  Good peripheral circulation. Respiratory: Normal respiratory effort.  No retractions. No wheezes, rales or rhonchi. Good air movement.  Musculoskeletal: Ambulatory with steady gait.  Neurologic:  Normal speech and language. No gait instability. Skin:  Skin appears warm, dry and intact. No rash noted. Psychiatric: Mood and affect are normal. Speech and behavior are normal.   ___________________________________________   LABS (all labs ordered are listed, but only abnormal results are displayed)  Labs Reviewed  NOVEL CORONAVIRUS, NAA (HOSP ORDER, SEND-OUT TO REF LAB; TAT 18-24 HRS)     PROCEDURES Procedures   INITIAL IMPRESSION / ASSESSMENT AND PLAN / ED COURSE  Pertinent labs & imaging results that were available during my care of the patient were reviewed by me and considered in my medical decision making (see chart for details).  Very well-appearing patient.  No acute distress.  Suspect viral illness.  COVID-19 testing completed and advice given.  Encourage rest, fluids, supportive care.  Work note given.  Over-the-counter medication as needed.  Discussed follow up with Primary care physician this week as needed. Discussed follow up and return parameters including no resolution or any worsening concerns. Patient verbalized understanding and agreed to plan.   ____________________________________________   FINAL CLINICAL IMPRESSION(S) / ED DIAGNOSES  Final diagnoses:  Upper respiratory tract infection, unspecified type  Advice given about COVID-19 virus infection     ED Discharge Orders    None       Note: This dictation was prepared with Dragon dictation along with smaller phrase technology. Any transcriptional errors that result from this process are unintentional.         Renford Dills, NP 01/18/19 1540

## 2019-01-18 NOTE — Discharge Instructions (Signed)
Rest. Drink plenty of fluids.  ° °Follow up with your primary care physician this week as needed. Return to Urgent care for new or worsening concerns.  ° °

## 2019-01-18 NOTE — ED Triage Notes (Signed)
Patient states that she has been having a cough/fever since 2-3 nights ago. Patient reports that the fever has been mainly at night. Patient would like to be covid tested.

## 2019-01-19 LAB — NOVEL CORONAVIRUS, NAA (HOSP ORDER, SEND-OUT TO REF LAB; TAT 18-24 HRS): SARS-CoV-2, NAA: NOT DETECTED

## 2019-03-06 ENCOUNTER — Encounter: Payer: Self-pay | Admitting: Podiatry

## 2019-03-06 ENCOUNTER — Other Ambulatory Visit: Payer: Self-pay

## 2019-03-06 ENCOUNTER — Ambulatory Visit (INDEPENDENT_AMBULATORY_CARE_PROVIDER_SITE_OTHER): Payer: BC Managed Care – PPO

## 2019-03-06 ENCOUNTER — Ambulatory Visit (INDEPENDENT_AMBULATORY_CARE_PROVIDER_SITE_OTHER): Payer: BC Managed Care – PPO | Admitting: Podiatry

## 2019-03-06 VITALS — BP 119/88

## 2019-03-06 DIAGNOSIS — M79672 Pain in left foot: Secondary | ICD-10-CM | POA: Diagnosis not present

## 2019-03-06 DIAGNOSIS — M79671 Pain in right foot: Secondary | ICD-10-CM

## 2019-03-06 DIAGNOSIS — M722 Plantar fascial fibromatosis: Secondary | ICD-10-CM

## 2019-03-07 ENCOUNTER — Encounter: Payer: Self-pay | Admitting: Podiatry

## 2019-03-07 NOTE — Progress Notes (Signed)
Subjective:  Patient ID: Jordan Solis, female    DOB: Mar 15, 1995,  MRN: [id number]  Chief Complaint  Patient presents with  . Foot Pain    pt is here for bil foot pain, pt states the foot pain of the right     24 y.o. female presents with the above complaint.  Patient presents with bilateral heel pain has been going on for about a year.  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  She states that the heel/the ankle are medially/internally rotated thus causing her some pain as well.  She states the pain is elevated when walking for long period of time.  Patient has tried over-the-counter orthotics but has not helped.  Her pain scale is 8 out of 10.  She denies any other acute complaints.  She denies seeing anyone else for this.   Review of Systems: Negative except as noted in the HPI. Denies N/V/F/Ch.  Past Medical History:  Diagnosis Date  . Asthma     Current Outpatient Medications:  .  acetaminophen (TYLENOL) 500 MG tablet, Take by mouth., Disp: , Rfl:  .  albuterol (PROVENTIL HFA;VENTOLIN HFA) 108 (90 Base) MCG/ACT inhaler, Inhale 2 puffs into the lungs every 4 (four) hours as needed for wheezing or shortness of breath., Disp: 1 Inhaler, Rfl: 0 .  albuterol (PROVENTIL HFA;VENTOLIN HFA) 108 (90 Base) MCG/ACT inhaler, Inhale 2 puffs into the lungs every 4 (four) hours as needed for wheezing or shortness of breath., Disp: 1 Inhaler, Rfl: 0 .  BREO ELLIPTA 200-25 MCG/INH AEPB, , Disp: , Rfl:  .  cetirizine HCl (ZYRTEC) 1 MG/ML solution, Take by mouth daily., Disp: , Rfl:  .  chlorhexidine (PERIDEX) 0.12 % solution, , Disp: , Rfl:  .  chlorhexidine (PERIDEX) 0.12 % solution, 15 mL by Mouth route Two (2) times a day., Disp: , Rfl:  .  Cholecalciferol (VITAMIN D3) 75 MCG (3000 UT) TABS, Take by mouth., Disp: , Rfl:  .  clindamycin-benzoyl peroxide (BENZACLIN) gel, Apply topically., Disp: , Rfl:  .  fluticasone (FLONASE) 50 MCG/ACT  nasal spray, Place into the nose., Disp: , Rfl:  .  HYDROcodone-acetaminophen (NORCO/VICODIN) 5-325 MG tablet, Take by mouth., Disp: , Rfl:  .  hydrOXYzine (ATARAX/VISTARIL) 25 MG tablet, Take 1 tablet (25 mg total) by mouth at bedtime., Disp: 20 tablet, Rfl: 0 .  ibuprofen (ADVIL) 600 MG tablet, , Disp: , Rfl:  .  ipratropium (ATROVENT) 0.03 % nasal spray, Place into the nose., Disp: , Rfl:  .  methocarbamol (ROBAXIN) 500 MG tablet, Take by mouth., Disp: , Rfl:  .  montelukast (SINGULAIR) 10 MG tablet, Take by mouth., Disp: , Rfl:  .  Multiple Vitamin (QUINTABS) TABS, Take by mouth., Disp: , Rfl:  .  Olopatadine HCl 0.2 % SOLN, Apply to eye., Disp: , Rfl:  .  omeprazole (PRILOSEC) 20 MG capsule, Take 20 mg by mouth daily., Disp: , Rfl:  .  Spacer/Aero-Holding Chambers (AEROCHAMBER PLUS) inhaler, Use as instructed, Disp: 1 each, Rfl: 2  Social History   Tobacco Use  Smoking Status Never Smoker  Smokeless Tobacco Never Used    Allergies  Allergen Reactions  . Penicillins Anaphylaxis  . Shellfish Allergy Anaphylaxis  . Pork Allergy Rash   Objective:   Vitals:   03/06/19 1440  BP: 119/88   There is no height or weight on file to calculate BMI. Constitutional Well developed. Well nourished.  Vascular Dorsalis  pedis pulses palpable bilaterally. Posterior tibial pulses palpable bilaterally. Capillary refill normal to all digits.  No cyanosis or clubbing noted. Pedal hair growth normal.  Neurologic Normal speech. Oriented to person, place, and time. Epicritic sensation to light touch grossly present bilaterally.  Dermatologic Nails well groomed and normal in appearance. No open wounds. No skin lesions.  Orthopedic: Normal joint ROM without pain or crepitus bilaterally. No visible deformities. Tender to palpation at the calcaneal tuber bilaterally. No pain with calcaneal squeeze bilaterally. Ankle ROM full range of motion bilaterally. Silfverskiold Test: negative  bilaterally.   Radiographs: Taken and reviewed. No acute fractures or dislocations. No evidence of stress fracture.  Plantar heel spur absent. Posterior heel spur absent.   Assessment:   1. Plantar fasciitis    Plan:  Patient was evaluated and treated and all questions answered.  Plantar Fasciitis, bilaterally - XR reviewed as above.  - Educated on icing and stretching. Instructions given.  - Injection delivered to the plantar fascia as below. - DME: Plantar Fascial Brace x2 - Pharmacologic management: None  Pes planus deformity semiflexible -I explained to the patient the etiology of pes planus deformity and various treatment options associated with it.  I believe patient will benefit from orthotics management to help address the arch of the foot as well as control the hindfoot motion.  Patient will be scheduled to see [facility] for custom-made orthotics  Procedure: Injection Tendon/Ligament Location: Bilateral plantar fascia at the glabrous junction; medial approach. Skin Prep: alcohol Injectate: 0.5 cc 0.5% marcaine plain, 0.5 cc of 1% Lidocaine, 0.5 cc kenalog 10. Disposition: Patient tolerated procedure well. Injection site dressed with a band-aid.  Return for [employer] with Liliane Channel for [employer].

## 2019-04-04 ENCOUNTER — Ambulatory Visit (INDEPENDENT_AMBULATORY_CARE_PROVIDER_SITE_OTHER): Payer: BC Managed Care – PPO | Admitting: Podiatry

## 2019-04-04 ENCOUNTER — Other Ambulatory Visit: Payer: Self-pay

## 2019-04-04 DIAGNOSIS — M722 Plantar fascial fibromatosis: Secondary | ICD-10-CM

## 2019-04-04 DIAGNOSIS — Q666 Other congenital valgus deformities of feet: Secondary | ICD-10-CM | POA: Diagnosis not present

## 2019-04-05 ENCOUNTER — Encounter: Payer: Self-pay | Admitting: Podiatry

## 2019-04-05 NOTE — Progress Notes (Signed)
Subjective:  Patient ID: Jordan Solis, female    DOB: 03-03-1995,  MRN: [id number]  Chief Complaint  Patient presents with  . Foot Pain    pt is here for a f/u on bil foot pain, pt states that she is feeling alot better since the last time she was here, pt states that she has been wearing her plantar fascial braces. Pt also states that the injection she recieved last time has helped tremendously. Pt has no other comments or concerns    24 y.o. female presents with the above complaint.  Patient is following up with bilateral plantar fasciitis.  Patient states she is feeling a lot better.  Her injection had given her complete about 70% relief of her pain.  Patient states that she has been wearing her braces which has tremendously helped.  Her pain scale is now 3 out of 10.  She denies any other acute complaints.  She would like to know what her neck steps are.  She has been doing her stretching exercises.  She has made some shoe gear modifications as well.   Review of Systems: Negative except as noted in the HPI. Denies N/V/F/Ch.  Past Medical History:  Diagnosis Date  . Asthma     Current Outpatient Medications:  .  acetaminophen (TYLENOL) 500 MG tablet, Take by mouth., Disp: , Rfl:  .  albuterol (PROVENTIL HFA;VENTOLIN HFA) 108 (90 Base) MCG/ACT inhaler, Inhale 2 puffs into the lungs every 4 (four) hours as needed for wheezing or shortness of breath., Disp: 1 Inhaler, Rfl: 0 .  albuterol (PROVENTIL HFA;VENTOLIN HFA) 108 (90 Base) MCG/ACT inhaler, Inhale 2 puffs into the lungs every 4 (four) hours as needed for wheezing or shortness of breath., Disp: 1 Inhaler, Rfl: 0 .  BREO ELLIPTA 200-25 MCG/INH AEPB, , Disp: , Rfl:  .  cetirizine HCl (ZYRTEC) 1 MG/ML solution, Take by mouth daily., Disp: , Rfl:  .  chlorhexidine (PERIDEX) 0.12 % solution, , Disp: , Rfl:  .  chlorhexidine (PERIDEX) 0.12 % solution, 15 mL by Mouth route Two (2) times a day., Disp: , Rfl:  .  Cholecalciferol (VITAMIN  D3) 75 MCG (3000 UT) TABS, Take by mouth., Disp: , Rfl:  .  clindamycin-benzoyl peroxide (BENZACLIN) gel, Apply topically., Disp: , Rfl:  .  fluticasone (FLONASE) 50 MCG/ACT nasal spray, Place into the nose., Disp: , Rfl:  .  HYDROcodone-acetaminophen (NORCO/VICODIN) 5-325 MG tablet, Take by mouth., Disp: , Rfl:  .  hydrOXYzine (ATARAX/VISTARIL) 25 MG tablet, Take 1 tablet (25 mg total) by mouth at bedtime., Disp: 20 tablet, Rfl: 0 .  ibuprofen (ADVIL) 600 MG tablet, , Disp: , Rfl:  .  ipratropium (ATROVENT) 0.03 % nasal spray, Place into the nose., Disp: , Rfl:  .  methocarbamol (ROBAXIN) 500 MG tablet, Take by mouth., Disp: , Rfl:  .  montelukast (SINGULAIR) 10 MG tablet, Take by mouth., Disp: , Rfl:  .  Multiple Vitamin (QUINTABS) TABS, Take by mouth., Disp: , Rfl:  .  Olopatadine HCl 0.2 % SOLN, Apply to eye., Disp: , Rfl:  .  omeprazole (PRILOSEC) 20 MG capsule, Take 20 mg by mouth daily., Disp: , Rfl:  .  Spacer/Aero-Holding Chambers (AEROCHAMBER PLUS) inhaler, Use as instructed, Disp: 1 each, Rfl: 2  Social History   Tobacco Use  Smoking Status Never Smoker  Smokeless Tobacco Never Used    Allergies  Allergen Reactions  . Penicillins Anaphylaxis  . Shellfish Allergy Anaphylaxis  . Pork Allergy Rash  Objective:   There were no vitals filed for this visit. There is no height or weight on file to calculate BMI. Constitutional Well developed. Well nourished.  Vascular Dorsalis pedis pulses palpable bilaterally. Posterior tibial pulses palpable bilaterally. Capillary refill normal to all digits.  No cyanosis or clubbing noted. Pedal hair growth normal.  Neurologic Normal speech. Oriented to person, place, and time. Epicritic sensation to light touch grossly present bilaterally.  Dermatologic Nails well groomed and normal in appearance. No open wounds. No skin lesions.  Orthopedic: Normal joint ROM without pain or crepitus bilaterally. No visible deformities. Mild pain  to palpation at the calcaneal tuber bilaterally. No pain with calcaneal squeeze bilaterally. Ankle ROM full range of motion bilaterally. Silfverskiold Test: negative bilaterally.   Radiographs: Taken and reviewed.  None  Assessment:   1. Plantar fasciitis of right foot   2. Plantar fasciitis of left foot   3. Pes planovalgus    Plan:  Patient was evaluated and treated and all questions answered.  Plantar Fasciitis, bilaterally - XR reviewed as above.  - Re-ducated on icing and stretching. Instructions given.  - Injection delivered to the plantar fascia as below. - DME: Night splint x2 - Pharmacologic management: None  Pes planus deformity semiflexible -I explained to the patient the etiology of pes planus deformity and various treatment options associated with it.  I believe patient will benefit from orthotics management to help address the arch of the foot as well as control the hindfoot motion.  Patient will be scheduled to see [facility] for custom-made orthotics  Procedure: Injection Tendon/Ligament Location: Bilateral plantar fascia at the glabrous junction; medial approach. Skin Prep: alcohol Injectate: 0.5 cc 0.5% marcaine plain, 0.5 cc of 1% Lidocaine, 0.5 cc kenalog 10. Disposition: Patient tolerated procedure well. Injection site dressed with a band-aid.  No follow-ups on file.

## 2019-04-12 ENCOUNTER — Other Ambulatory Visit: Payer: BC Managed Care – PPO | Admitting: Orthotics

## 2019-05-10 ENCOUNTER — Other Ambulatory Visit: Payer: Self-pay

## 2019-05-10 ENCOUNTER — Ambulatory Visit (INDEPENDENT_AMBULATORY_CARE_PROVIDER_SITE_OTHER): Payer: BC Managed Care – PPO | Admitting: Orthotics

## 2019-05-10 DIAGNOSIS — M722 Plantar fascial fibromatosis: Secondary | ICD-10-CM

## 2019-05-10 DIAGNOSIS — Q666 Other congenital valgus deformities of feet: Secondary | ICD-10-CM

## 2019-05-10 NOTE — Progress Notes (Signed)

## 2019-08-28 ENCOUNTER — Encounter: Payer: Self-pay | Admitting: Emergency Medicine

## 2019-08-28 ENCOUNTER — Other Ambulatory Visit: Payer: Self-pay

## 2019-08-28 ENCOUNTER — Ambulatory Visit (INDEPENDENT_AMBULATORY_CARE_PROVIDER_SITE_OTHER): Payer: Federal, State, Local not specified - PPO

## 2019-08-28 ENCOUNTER — Ambulatory Visit
Admission: EM | Admit: 2019-08-28 | Discharge: 2019-08-28 | Disposition: A | Discharge status: Home / Self Care | Payer: Federal, State, Local not specified - PPO | Attending: Family Medicine | Admitting: Family Medicine

## 2019-08-28 DIAGNOSIS — S63611A Unspecified sprain of left index finger, initial encounter: Secondary | ICD-10-CM

## 2019-08-28 MED ORDER — MELOXICAM 15 MG PO TABS: 15.0000 mg | ORAL_TABLET | Freq: Every day | ORAL | 0 refills | Status: DC | PRN

## 2019-08-28 NOTE — Discharge Instructions (Signed)
Medication as needed.  No evidence of fracture.  Take care  Dr. Adriana Simas

## 2019-08-28 NOTE — ED Provider Notes (Signed)
[facility]    CSN: [id number] Arrival date & time: 08/28/19  1018  History   Chief Complaint Chief Complaint  Patient presents with  . Finger Injury    left index finger   HPI   24 year old female presents with the above complaint.  Patient states that she suffered a fall 1 week ago.  Patient reports that she fell into a fireplace and injured her left index finger.  Patient reports that she had an abrasion but this is healed.  However, she continued to have pain and reported swelling of the left index finger just below the PIP joint.  No relieving factors.  Rates her pain as 5/10 in severity.  No other associated symptoms.  No other complaints.  Past Medical History:  Diagnosis Date  . Asthma    Patient Active Problem List   Diagnosis Date Noted  . Allergic rhinitis 01/04/2018  . Migraine 01/04/2018  . Abnormal uterine bleeding (AUB) 05/01/2016  . Scoliosis of thoracolumbar spine 05/01/2016  . Vitamin D deficiency 05/01/2016  . Unspecified asthma, uncomplicated 08/16/2012    Past Surgical History:  Procedure Laterality Date  . NO PAST SURGERIES      OB History   No obstetric history on file.      Home Medications    Prior to Admission medications   Medication Sig Start Date End Date Taking? Authorizing Provider  acetaminophen (TYLENOL) 500 MG tablet Take by mouth. 11/09/14  Yes [provider]  albuterol (PROVENTIL HFA;VENTOLIN HFA) 108 (90 Base) MCG/ACT inhaler Inhale 2 puffs into the lungs every 4 (four) hours as needed for wheezing or shortness of breath. 05/04/17  Yes Domenick Gong, MD  BREO ELLIPTA 200-25 MCG/INH AEPB  10/21/18  Yes [provider]  cetirizine HCl (ZYRTEC) 1 MG/ML solution Take by mouth daily.   Yes [provider]  Cholecalciferol (VITAMIN D3) 75 MCG (3000 UT) TABS Take by mouth.   Yes [provider]  fluticasone (FLONASE) 50 MCG/ACT nasal spray Place into the nose. 08/23/15  Yes [provider]  hydrOXYzine (ATARAX/VISTARIL) 25 MG tablet Take 1 tablet (25 mg total) by mouth at bedtime. 02/17/18  Yes Domenick Gong, MD  ipratropium (ATROVENT) 0.03 % nasal spray Place into the nose. 09/15/18 09/15/19 Yes [provider]  montelukast (SINGULAIR) 10 MG tablet Take by mouth. 07/10/15  Yes [provider]  Multiple Vitamin (QUINTABS) TABS Take by mouth.   Yes [provider]  omeprazole (PRILOSEC) 20 MG capsule Take 20 mg by mouth daily. 09/15/18  Yes [provider]  albuterol (PROVENTIL HFA;VENTOLIN HFA) 108 (90 Base) MCG/ACT inhaler Inhale 2 puffs into the lungs every 4 (four) hours as needed for wheezing or shortness of breath. 07/04/17   Renford Dills, NP  chlorhexidine (PERIDEX) 0.12 % solution  09/26/18   [provider]  chlorhexidine (PERIDEX) 0.12 % solution 15 mL by Mouth route Two (2) times a day. 09/26/18   [provider]  meloxicam (MOBIC) 15 MG tablet Take 1 tablet (15 mg total) by mouth daily as needed for pain. 08/28/19   Tommie Sams, DO  Spacer/Aero-Holding Chambers (AEROCHAMBER PLUS) inhaler Use as instructed 05/04/17   Domenick Gong, MD    Family History Family History  Problem Relation Age of Onset  . Healthy Mother   . Healthy Father     Social History Social History   Tobacco Use  . Smoking status: Never Smoker  . Smokeless tobacco: Never Used  Vaping Use  .  Vaping Use: Never used  Substance Use Topics  . Alcohol use: No  . Drug use: No     Allergies   Penicillins, Shellfish allergy, and Pork allergy   Review of Systems Review of Systems  Constitutional: Negative.   Musculoskeletal:       Left index finger pain.   Physical Exam Triage Vital Signs ED Triage Vitals  Enc Vitals Group     BP 08/28/19 1033 109/76     Pulse Rate 08/28/19 1033 72     Resp 08/28/19 1033 18     Temp 08/28/19 1033 98.9 F (37.2 C)     Temp Source 08/28/19 1033 Oral     SpO2 08/28/19 1033 100 %      Weight 08/28/19 1030 190 lb 0.6 oz (86.2 kg)     Height 08/28/19 1030 5\' 2"  (1.575 m)     Head Circumference --      Peak Flow --      Pain Score 08/28/19 1029 5     Pain Loc --      Pain Edu? --      Excl. in GC? --    Updated Vital Signs BP 109/76 (BP Location: Left Arm)   Pulse 72   Temp 98.9 F (37.2 C) (Oral)   Resp 18   Ht 5\' 2"  (1.575 m)   Wt 86.2 kg   LMP 08/28/2019 (Exact Date)   SpO2 100%   BMI 34.76 kg/m   Visual Acuity Right Eye Distance:   Left Eye Distance:   Bilateral Distance:    Right Eye Near:   Left Eye Near:    Bilateral Near:     Physical Exam Vitals and nursing note reviewed.  Constitutional:      General: She is not in acute distress.    Appearance: Normal appearance. She is not ill-appearing.  HENT:     Head: Normocephalic and atraumatic.  Eyes:     General:        Right eye: No discharge.        Left eye: No discharge.     Conjunctiva/sclera: Conjunctivae normal.  Pulmonary:     Effort: Pulmonary effort is normal. No respiratory distress.  Musculoskeletal:     Comments: Left index finger with tenderness to palpation just below the PIP joint.  No wound noted.  No swelling noted.  Neurological:     Mental Status: She is alert.  Psychiatric:        Mood and Affect: Mood normal.        Behavior: Behavior normal.    UC Treatments / Results  Labs (all labs ordered are listed, but only abnormal results are displayed) Labs Reviewed - No data to display  EKG   Radiology DG Finger Index Left  Result Date: 08/28/2019 CLINICAL DATA:  Pt c/o left index finger pain. She states she fell and his her finger on her fire place about a week ago. She has had some swelling. No bruising. pt states she had a scrape on area where swelling is medial between PIP joint and MCP joint. EXAM: LEFT INDEX FINGER 2+V COMPARISON:  None. FINDINGS: There is no evidence of fracture or dislocation. There is no evidence of arthropathy or other focal bone  abnormality. Soft tissues are unremarkable. IMPRESSION: Negative radiographs of the left second/index finger. Electronically Signed   By: 08/30/2019 M.D.   On: 08/28/2019 10:54    Procedures Procedures (including critical care time)  Medications Ordered in UC  Medications - No data to display  Initial Impression / Assessment and Plan / UC Course  I have reviewed the triage vital signs and the nursing notes.  Pertinent labs & imaging results that were available during my care of the patient were reviewed by me and considered in my medical decision making (see chart for details).    24 year old female presents with a finger injury.  X-ray obtained today and independently reviewed by me.  Interpretation: No acute fracture.  Meloxicam as directed.  Supportive care.  Final Clinical Impressions(s) / UC Diagnoses   Final diagnoses:  Sprain of left index finger, unspecified site of digit, initial encounter     Discharge Instructions     Medication as needed.  No evidence of fracture.  Take care  Dr. Adriana Simas    ED Prescriptions    Medication Sig Dispense Auth. Provider   meloxicam (MOBIC) 15 MG tablet Take 1 tablet (15 mg total) by mouth daily as needed for pain. 30 tablet Tommie Sams, DO     PDMP not reviewed this encounter.   Tommie Sams, DO 08/28/19 1505

## 2019-08-28 NOTE — ED Triage Notes (Signed)
Pt c/o left index finger pain. She states she fell and his her finger on her fire place about a week ago. She has had some swelling. No bruising.

## 2019-10-06 ENCOUNTER — Encounter: Payer: Self-pay | Admitting: *Deleted

## 2019-10-06 NOTE — Progress Notes (Signed)
Jordan Solis came by to pick up her orthotics, instructions were given.

## 2020-01-31 ENCOUNTER — Encounter: Payer: Self-pay | Admitting: Emergency Medicine

## 2020-01-31 ENCOUNTER — Ambulatory Visit
Admission: EM | Admit: 2020-01-31 | Discharge: 2020-01-31 | Disposition: A | Discharge status: Home / Self Care | Payer: BC Managed Care – PPO | Attending: Sports Medicine | Admitting: Sports Medicine

## 2020-01-31 ENCOUNTER — Other Ambulatory Visit: Payer: Self-pay

## 2020-01-31 DIAGNOSIS — K529 Noninfective gastroenteritis and colitis, unspecified: Secondary | ICD-10-CM | POA: Insufficient documentation

## 2020-01-31 DIAGNOSIS — Z20822 Contact with and (suspected) exposure to covid-19: Secondary | ICD-10-CM | POA: Insufficient documentation

## 2020-01-31 LAB — RESP PANEL BY RT-PCR (FLU A&B, COVID) ARPGX2
Influenza A by PCR: NEGATIVE
Influenza B by PCR: NEGATIVE
SARS Coronavirus 2 by RT PCR: NEGATIVE

## 2020-01-31 MED ORDER — DICYCLOMINE HCL 20 MG PO TABS: 20.0000 mg | ORAL_TABLET | Freq: Two times a day (BID) | ORAL | 0 refills | Status: DC

## 2020-01-31 MED ORDER — ONDANSETRON 8 MG PO TBDP: 8.0000 mg | ORAL_TABLET | Freq: Three times a day (TID) | ORAL | 0 refills | Status: DC | PRN

## 2020-01-31 NOTE — ED Provider Notes (Signed)
[facility]    CSN: [id number] Arrival date & time: 01/31/20  1041      History   Chief Complaint Chief Complaint  Patient presents with  . Abdominal Pain  . Emesis    HPI Jordan Solis is a 24 y.o. female.   HPI   24 year old female here for evaluation of generalized abdominal pain vomiting, diarrhea, chills and fever.  Patient reports her symptoms started 5 days ago.  Patient has not been vaccinated gets Covid or flu.  Patient reports that the highest her fever reach was 101.  She said 1-2 episodes of emesis and 1-2 episodes of diarrhea daily she has not seen any blood in her diarrhea.  Her abdominal pain is generalized.  She is also had a cough and body aches.  Patient denies changes in appetite urinary symptoms, runny nose, or ear pain.  Patient does report having a scratchy sore throat.  Past Medical History:  Diagnosis Date  . Asthma     Patient Active Problem List   Diagnosis Date Noted  . Allergic rhinitis 01/04/2018  . Migraine 01/04/2018  . Abnormal uterine bleeding (AUB) 05/01/2016  . Scoliosis of thoracolumbar spine 05/01/2016  . Vitamin D deficiency 05/01/2016  . Unspecified asthma, uncomplicated 08/16/2012    Past Surgical History:  Procedure Laterality Date  . NO PAST SURGERIES      OB History   No obstetric history on file.      Home Medications    Prior to Admission medications   Medication Sig Start Date End Date Taking? Authorizing Provider  acetaminophen (TYLENOL) 500 MG tablet Take by mouth. 11/09/14  Yes [provider]  albuterol (PROVENTIL HFA;VENTOLIN HFA) 108 (90 Base) MCG/ACT inhaler Inhale 2 puffs into the lungs every 4 (four) hours as needed for wheezing or shortness of breath. 05/04/17  Yes Domenick Gong, MD  albuterol (PROVENTIL HFA;VENTOLIN HFA) 108 (90 Base) MCG/ACT inhaler Inhale 2 puffs into the lungs every 4 (four) hours as needed for wheezing or shortness of breath. 07/04/17  Yes Renford Dills,  NP  Virgel Bouquet ELLIPTA 200-25 MCG/INH AEPB  10/21/18  Yes [provider]  cetirizine HCl (ZYRTEC) 1 MG/ML solution Take by mouth daily.   Yes [provider]  Cholecalciferol (VITAMIN D3) 75 MCG (3000 UT) TABS Take by mouth.   Yes [provider]  fluticasone (FLONASE) 50 MCG/ACT nasal spray Place into the nose. 08/23/15  Yes [provider]  hydrOXYzine (ATARAX/VISTARIL) 25 MG tablet Take 1 tablet (25 mg total) by mouth at bedtime. 02/17/18  Yes Domenick Gong, MD  montelukast (SINGULAIR) 10 MG tablet Take by mouth. 07/10/15  Yes [provider]  Multiple Vitamin (QUINTABS) TABS Take by mouth.   Yes [provider]  omeprazole (PRILOSEC) 20 MG capsule Take 20 mg by mouth daily. 09/15/18  Yes [provider]  chlorhexidine (PERIDEX) 0.12 % solution  09/26/18   [provider]  chlorhexidine (PERIDEX) 0.12 % solution 15 mL by Mouth route Two (2) times a day. 09/26/18   [provider]  dicyclomine (BENTYL) 20 MG tablet Take 1 tablet (20 mg total) by mouth 2 (two) times daily. 01/31/20   Becky Augusta, NP  ipratropium (ATROVENT) 0.03 % nasal spray Place into the nose. 09/15/18 09/15/19  [provider]  ondansetron (ZOFRAN ODT) 8 MG disintegrating tablet Take 1 tablet (8 mg total) by mouth every 8 (eight) hours as needed for nausea or vomiting. 01/31/20   Becky Augusta, NP  Spacer/Aero-Holding Deretha Emory (  AEROCHAMBER PLUS) inhaler Use as instructed 05/04/17   Domenick Gong, MD    Family History Family History  Problem Relation Age of Onset  . Healthy Mother   . Healthy Father     Social History Social History   Tobacco Use  . Smoking status: Never Smoker  . Smokeless tobacco: Never Used  Vaping Use  . Vaping Use: Never used  Substance Use Topics  . Alcohol use: No  . Drug use: No     Allergies   Penicillins, Shellfish allergy, and Pork allergy   Review of Systems Review of Systems  Constitutional:  Positive for chills and fever. Negative for activity change and appetite change.  HENT: Positive for sore throat. Negative for congestion, ear pain, rhinorrhea, sinus pressure and sinus pain.   Respiratory: Positive for cough. Negative for shortness of breath and wheezing.   Gastrointestinal: Positive for abdominal pain, diarrhea, nausea and vomiting. Negative for blood in stool.  Genitourinary: Negative for dysuria, frequency, hematuria and urgency.  Musculoskeletal: Positive for arthralgias and myalgias.  Skin: Negative for rash.  Neurological: Negative for headaches.  Hematological: Negative.   Psychiatric/Behavioral: Negative.      Physical Exam Triage Vital Signs ED Triage Vitals  Enc Vitals Group     BP 01/31/20 1130 118/77     Pulse Rate 01/31/20 1130 87     Resp 01/31/20 1130 18     Temp 01/31/20 1130 98.6 F (37 C)     Temp Source 01/31/20 1130 Oral     SpO2 01/31/20 1130 100 %     Weight 01/31/20 1128 190 lb 0.6 oz (86.2 kg)     Height 01/31/20 1128 5\' 2"  (1.575 m)     Head Circumference --      Peak Flow --      Pain Score 01/31/20 1128 5     Pain Loc --      Pain Edu? --      Excl. in GC? --    No data found.  Updated Vital Signs BP 118/77 (BP Location: Left Arm)   Pulse 87   Temp 98.6 F (37 C) (Oral)   Resp 18   Ht 5\' 2"  (1.575 m)   Wt 190 lb 0.6 oz (86.2 kg)   LMP 01/17/2020   SpO2 100%   BMI 34.76 kg/m   Visual Acuity Right Eye Distance:   Left Eye Distance:   Bilateral Distance:    Right Eye Near:   Left Eye Near:    Bilateral Near:     Physical Exam Vitals and nursing note reviewed.  Constitutional:      General: She is not in acute distress.    Appearance: She is well-developed. She is not toxic-appearing.  HENT:     Head: Normocephalic and atraumatic.     Comments: Nasal mucosa is erythematous and edematous bilaterally with some clear nasal discharge.  Patient does have mild tenderness to maxillary sinuses bilaterally.  Frontal  sinuses are nontender.    Mouth/Throat:     Mouth: Mucous membranes are moist.     Pharynx: Oropharynx is clear.     Comments: Posterior oropharynx is mildly erythematous with clear postnasal drip. Cardiovascular:     Rate and Rhythm: Normal rate and regular rhythm.     Heart sounds: Normal heart sounds. No murmur heard. No gallop.   Pulmonary:     Effort: Pulmonary effort is normal.     Breath sounds: Normal breath sounds. No wheezing, rhonchi or rales.  Abdominal:     General: Abdomen is flat. Bowel sounds are normal. There is no distension.     Palpations: Abdomen is soft. There is no hepatomegaly.     Tenderness: There is generalized abdominal tenderness.     Hernia: No hernia is present.  Skin:    General: Skin is warm and dry.     Capillary Refill: Capillary refill takes less than 2 seconds.     Findings: No erythema or rash.  Neurological:     General: No focal deficit present.     Mental Status: She is alert and oriented to person, place, and time.  Psychiatric:        Mood and Affect: Mood normal.        Behavior: Behavior normal.      UC Treatments / Results  Labs (all labs ordered are listed, but only abnormal results are displayed) Labs Reviewed  RESP PANEL BY RT-PCR (FLU A&B, COVID) [id number]    EKG   Radiology No results found.  Procedures Procedures (including critical care time)  Medications Ordered in UC Medications - No data to display  Initial Impression / Assessment and Plan / UC Course  I have reviewed the triage vital signs and the nursing notes.  Pertinent labs & imaging results that were available during my care of the patient were reviewed by me and considered in my medical decision making (see chart for details).   Patient is here for evaluation of generalized abdominal pain, nausea, vomiting, diarrhea, chills, and fever.  Physical exam reveals erythematous and edematous nasal mucosa with clear nasal discharge patient is clear postnasal  drip and an erythematous posterior oropharynx.  Patient's lungs are clear to auscultation.  Abdomen is soft with positive bowel sounds in all 4 quadrants.  Patient has generalized tenderness without focal findings, guarding, or rebound.  Concern patient has Covid.  Patient states that she does not like things go up her nose.  Upon discussing her symptoms further she agreed to testing.  Patient is negative for Covid.  Suspect patient has a gastrointestinal virus.  Will give Zofran for nausea and Bentyl for abdominal cramping.  Discharge patient home to follow-up with her primary care provider if symptoms continue to not improve.  Also advised to go to ER if symptoms worsen.   Final Clinical Impressions(s) / UC Diagnoses   Final diagnoses:  Gastroenteritis     Discharge Instructions     Use the Zofran every 8 hours as needed for nausea.  Just let it dissolve on under your tongue.  Use the Bentyl every 6 hours as needed for abdominal cramping.  Follow a clear liquid diet for the next 12 hours and then slowly increase your diet as tolerated.  If you have any increase in your abdominal pain, especially if your abdominal pain becomes severe in one location, go to the ER for evaluation.    ED Prescriptions    Medication Sig Dispense Auth. Provider   ondansetron (ZOFRAN ODT) 8 MG disintegrating tablet Take 1 tablet (8 mg total) by mouth every 8 (eight) hours as needed for nausea or vomiting. 20 tablet Becky Augusta, NP   dicyclomine (BENTYL) 20 MG tablet Take 1 tablet (20 mg total) by mouth 2 (two) times daily. 20 tablet Becky Augusta, NP     PDMP not reviewed this encounter.   Becky Augusta, NP 01/31/20 1309

## 2020-01-31 NOTE — Discharge Instructions (Addendum)
Use the Zofran every 8 hours as needed for nausea.  Just let it dissolve on under your tongue.  Use the Bentyl every 6 hours as needed for abdominal cramping.  Follow a clear liquid diet for the next 12 hours and then slowly increase your diet as tolerated.  If you have any increase in your abdominal pain, especially if your abdominal pain becomes severe in one location, go to the ER for evaluation.

## 2020-01-31 NOTE — ED Triage Notes (Signed)
Pt c/o mid abdominal pain, diarrhea, vomiting, chills and fever (t max 101). Started about a week ago. Declines covid and flu testing.

## 2020-02-13 ENCOUNTER — Encounter: Payer: Self-pay | Admitting: Podiatry

## 2020-02-13 ENCOUNTER — Other Ambulatory Visit: Payer: Self-pay

## 2020-02-13 ENCOUNTER — Ambulatory Visit (INDEPENDENT_AMBULATORY_CARE_PROVIDER_SITE_OTHER): Payer: Federal, State, Local not specified - PPO | Admitting: Podiatry

## 2020-02-13 DIAGNOSIS — M722 Plantar fascial fibromatosis: Secondary | ICD-10-CM

## 2020-02-13 NOTE — Progress Notes (Signed)
Subjective:  Patient ID: Jordan Solis, female    DOB: 1995/05/31,  MRN: [id number]  Chief Complaint  Patient presents with  . Plantar Fasciitis    "Im still having a lot of pain, more so in my heels.  Its very painful by the end of the day and my feet throb and my ankles are swollen"    25 y.o. female presents with the above complaint.  Patient is following with bilateral plantar fasciitis left greater than right side.  Patient states that her pain is still continuous.  She has not been wearing orthotics properly either.  I discussed with her the importance of wearing getting good shoes and making shoe gear modification.  She states understanding.  She denies any other acute complaints.   Review of Systems: Negative except as noted in the HPI. Denies N/V/F/Ch.  Past Medical History:  Diagnosis Date  . Asthma     Current Outpatient Medications:  .  acetaminophen (TYLENOL) 500 MG tablet, Take by mouth., Disp: , Rfl:  .  albuterol (PROVENTIL HFA;VENTOLIN HFA) 108 (90 Base) MCG/ACT inhaler, Inhale 2 puffs into the lungs every 4 (four) hours as needed for wheezing or shortness of breath., Disp: 1 Inhaler, Rfl: 0 .  albuterol (PROVENTIL HFA;VENTOLIN HFA) 108 (90 Base) MCG/ACT inhaler, Inhale 2 puffs into the lungs every 4 (four) hours as needed for wheezing or shortness of breath., Disp: 1 Inhaler, Rfl: 0 .  BREO ELLIPTA 200-25 MCG/INH AEPB, , Disp: , Rfl:  .  cetirizine HCl (ZYRTEC) 1 MG/ML solution, Take by mouth daily., Disp: , Rfl:  .  chlorhexidine (PERIDEX) 0.12 % solution, , Disp: , Rfl:  .  chlorhexidine (PERIDEX) 0.12 % solution, 15 mL by Mouth route Two (2) times a day., Disp: , Rfl:  .  Cholecalciferol (VITAMIN D3) 75 MCG (3000 UT) TABS, Take by mouth., Disp: , Rfl:  .  dicyclomine (BENTYL) 20 MG tablet, Take 1 tablet (20 mg total) by mouth 2 (two) times daily., Disp: 20 tablet, Rfl: 0 .  fluticasone (FLONASE) 50 MCG/ACT nasal spray, Place into the nose., Disp: , Rfl:  .   hydrOXYzine (ATARAX/VISTARIL) 25 MG tablet, Take 1 tablet (25 mg total) by mouth at bedtime., Disp: 20 tablet, Rfl: 0 .  montelukast (SINGULAIR) 10 MG tablet, Take by mouth., Disp: , Rfl:  .  Multiple Vitamin (QUINTABS) TABS, Take by mouth., Disp: , Rfl:  .  omeprazole (PRILOSEC) 20 MG capsule, Take 20 mg by mouth daily., Disp: , Rfl:  .  ondansetron (ZOFRAN ODT) 8 MG disintegrating tablet, Take 1 tablet (8 mg total) by mouth every 8 (eight) hours as needed for nausea or vomiting., Disp: 20 tablet, Rfl: 0 .  Spacer/Aero-Holding Chambers (AEROCHAMBER PLUS) inhaler, Use as instructed, Disp: 1 each, Rfl: 2  Social History   Tobacco Use  Smoking Status Never Smoker  Smokeless Tobacco Never Used    Allergies  Allergen Reactions  . Penicillins Anaphylaxis  . Shellfish Allergy Anaphylaxis  . Pork Allergy Rash   Objective:   There were no vitals filed for this visit. There is no height or weight on file to calculate BMI. Constitutional Well developed. Well nourished.  Vascular Dorsalis pedis pulses palpable bilaterally. Posterior tibial pulses palpable bilaterally. Capillary refill normal to all digits.  No cyanosis or clubbing noted. Pedal hair growth normal.  Neurologic Normal speech. Oriented to person, place, and time. Epicritic sensation to light touch grossly present bilaterally.  Dermatologic Nails well groomed and normal in appearance. No  open wounds. No skin lesions.  Orthopedic: Normal joint ROM without pain or crepitus bilaterally. No visible deformities. Mild pain to palpation at the calcaneal tuber bilaterally. No pain with calcaneal squeeze bilaterally. Ankle ROM full range of motion bilaterally. Silfverskiold Test: negative bilaterally.   Radiographs: Taken and reviewed.  None  Assessment:   No diagnosis found. Plan:  Patient was evaluated and treated and all questions answered.  Plantar Fasciitis, bilaterally left greater than right - XR reviewed as above.   - Re-ducated on icing and stretching. Instructions given.  -No further injection delivered to the plantar fascia  - DME: Night splint x2 and new set of plantar fascial braces x2 were dispensed - Pharmacologic management: None -Cam boot to the left side if there is no improvement with immobilization and is need to discontinue colposcopic plantar fasciotomy.  Pes planus deformity semiflexible -I explained to the patient the etiology of pes planus deformity and various treatment options associated with it.  I believe patient will benefit from orthotics management to help address the arch of the foot as well as control the hindfoot motion.  Patient will be scheduled to see Raiford Noble for custom-made orthotics -Patient has picked up the orthotics and is functioning well without acute problems  No follow-ups on file.

## 2020-02-23 DIAGNOSIS — M79676 Pain in unspecified toe(s): Secondary | ICD-10-CM

## 2020-02-29 ENCOUNTER — Telehealth: Payer: Self-pay | Admitting: *Deleted

## 2020-02-29 NOTE — Telephone Encounter (Signed)
I attempted to call the patient to let her know that her disability forms were ready for pick up.  Her voicemail was full, so I couldn't leave a message.

## 2020-03-14 ENCOUNTER — Encounter: Payer: Self-pay | Admitting: Podiatry

## 2020-03-14 ENCOUNTER — Ambulatory Visit (INDEPENDENT_AMBULATORY_CARE_PROVIDER_SITE_OTHER): Payer: BC Managed Care – PPO | Admitting: Podiatry

## 2020-03-14 ENCOUNTER — Other Ambulatory Visit: Payer: Self-pay

## 2020-03-14 DIAGNOSIS — M722 Plantar fascial fibromatosis: Secondary | ICD-10-CM

## 2020-03-19 ENCOUNTER — Encounter: Payer: Self-pay | Admitting: Podiatry

## 2020-03-19 NOTE — Progress Notes (Signed)
Subjective:  Patient ID: Jordan Solis, female    DOB: 09-27-1995,  MRN: [id number]  Chief Complaint  Patient presents with  . Foot Pain    "left  foot feels a little better, but its starting to hurt a little on the lateral side of ankle and heel.  My right foot is good"    25 y.o. female presents with the above complaint. Patient presents with follow-up of bilateral plantar fasciitis left greater than right side. The right side is completely resolved. Left that she still feels pain. The boot helped considerably. She can begin transition out of it. She denies any other acute complaints.   Review of Systems: Negative except as noted in the HPI. Denies N/V/F/Ch.  Past Medical History:  Diagnosis Date  . Asthma     Current Outpatient Medications:  .  acetaminophen (TYLENOL) 500 MG tablet, Take by mouth., Disp: , Rfl:  .  albuterol (PROVENTIL HFA;VENTOLIN HFA) 108 (90 Base) MCG/ACT inhaler, Inhale 2 puffs into the lungs every 4 (four) hours as needed for wheezing or shortness of breath., Disp: 1 Inhaler, Rfl: 0 .  albuterol (PROVENTIL HFA;VENTOLIN HFA) 108 (90 Base) MCG/ACT inhaler, Inhale 2 puffs into the lungs every 4 (four) hours as needed for wheezing or shortness of breath., Disp: 1 Inhaler, Rfl: 0 .  BREO ELLIPTA 200-25 MCG/INH AEPB, , Disp: , Rfl:  .  cetirizine HCl (ZYRTEC) 1 MG/ML solution, Take by mouth daily., Disp: , Rfl:  .  chlorhexidine (PERIDEX) 0.12 % solution, , Disp: , Rfl:  .  chlorhexidine (PERIDEX) 0.12 % solution, 15 mL by Mouth route Two (2) times a day., Disp: , Rfl:  .  Cholecalciferol (VITAMIN D3) 75 MCG (3000 UT) TABS, Take by mouth., Disp: , Rfl:  .  dicyclomine (BENTYL) 20 MG tablet, Take 1 tablet (20 mg total) by mouth 2 (two) times daily., Disp: 20 tablet, Rfl: 0 .  fluticasone (FLONASE) 50 MCG/ACT nasal spray, Place into the nose., Disp: , Rfl:  .  hydrOXYzine (ATARAX/VISTARIL) 25 MG tablet, Take 1 tablet (25 mg total) by mouth at bedtime., Disp: 20  tablet, Rfl: 0 .  montelukast (SINGULAIR) 10 MG tablet, Take by mouth., Disp: , Rfl:  .  Multiple Vitamin (QUINTABS) TABS, Take by mouth., Disp: , Rfl:  .  omeprazole (PRILOSEC) 20 MG capsule, Take 20 mg by mouth daily., Disp: , Rfl:  .  ondansetron (ZOFRAN ODT) 8 MG disintegrating tablet, Take 1 tablet (8 mg total) by mouth every 8 (eight) hours as needed for nausea or vomiting., Disp: 20 tablet, Rfl: 0 .  Spacer/Aero-Holding Chambers (AEROCHAMBER PLUS) inhaler, Use as instructed, Disp: 1 each, Rfl: 2  Social History   Tobacco Use  Smoking Status Never Smoker  Smokeless Tobacco Never Used    Allergies  Allergen Reactions  . Penicillins Anaphylaxis  . Shellfish Allergy Anaphylaxis  . Pork Allergy Rash   Objective:   There were no vitals filed for this visit. There is no height or weight on file to calculate BMI. Constitutional Well developed. Well nourished.  Vascular Dorsalis pedis pulses palpable bilaterally. Posterior tibial pulses palpable bilaterally. Capillary refill normal to all digits.  No cyanosis or clubbing noted. Pedal hair growth normal.  Neurologic Normal speech. Oriented to person, place, and time. Epicritic sensation to light touch grossly present bilaterally.  Dermatologic Nails well groomed and normal in appearance. No open wounds. No skin lesions.  Orthopedic: Normal joint ROM without pain or crepitus bilaterally. No visible deformities.  nopain  to palpation at the calcaneal tuber bilaterally. No pain with calcaneal squeeze bilaterally. Ankle ROM full range of motion bilaterally. Silfverskiold Test: negative bilaterally.   Radiographs: Taken and reviewed.  None  Assessment:   1. Plantar fasciitis of left foot   2. Plantar fasciitis of right foot    Plan:  Patient was evaluated and treated and all questions answered.  Right plantar fasciitis -Clinically healed  Plantar Fasciitis, primary left heel -Clinically both the plantar fasciitis  healed considerably. Overall she is doing much better with bracing injection night splints. At this time I have asked her to transition out of the boot. She states understanding. Her pain is primarily in the left heel now. -Patient doing well with cam boot. Patient can start transitioning out of the boot into regular shoes.  Pes planus deformity semiflexible -I explained to the patient the etiology of pes planus deformity and various treatment options associated with it.  I believe patient will benefit from orthotics management to help address the arch of the foot as well as control the hindfoot motion.  Patient will be scheduled to see Raiford Noble for custom-made orthotics -Patient has picked up the orthotics and is functioning well without acute problems  No follow-ups on file.

## 2020-04-10 ENCOUNTER — Telehealth: Payer: Self-pay | Admitting: *Deleted

## 2020-04-10 NOTE — Telephone Encounter (Signed)
I attempted to call the patient to inform her that someone, not in our practice, had completed her FMLA forms incorrectly.  I need new forms that have not been written on.  She did not answer and her voicemail was full, so I could not leave a message.  I called the number that was on the faxed letter from her requesting the FMLA forms to be completed. She did not answer.  I left her a number to call me back.  I called the mobile phone number again.  Jordan Solis's mom answered.  She stated Jordan Solis was not available.  She said she has an appointment on tomorrow and asked if I was calling regarding that.  I told her no, I was calling regarding her FMLA paperwork.  I told her I need a blank form because someone had completed the one we received incorrectly.  She said she would let her daughter know and have her bring it on tomorrow when she comes for her appointment.

## 2020-04-11 ENCOUNTER — Other Ambulatory Visit: Payer: Self-pay

## 2020-04-11 ENCOUNTER — Ambulatory Visit (INDEPENDENT_AMBULATORY_CARE_PROVIDER_SITE_OTHER): Payer: BC Managed Care – PPO | Admitting: Podiatry

## 2020-04-11 ENCOUNTER — Encounter: Payer: Self-pay | Admitting: *Deleted

## 2020-04-11 ENCOUNTER — Encounter: Payer: Self-pay | Admitting: Podiatry

## 2020-04-11 DIAGNOSIS — Z01818 Encounter for other preprocedural examination: Secondary | ICD-10-CM | POA: Diagnosis not present

## 2020-04-11 DIAGNOSIS — M722 Plantar fascial fibromatosis: Secondary | ICD-10-CM | POA: Diagnosis not present

## 2020-04-11 DIAGNOSIS — M21862 Other specified acquired deformities of left lower leg: Secondary | ICD-10-CM

## 2020-04-11 DIAGNOSIS — M216X2 Other acquired deformities of left foot: Secondary | ICD-10-CM

## 2020-04-11 NOTE — Progress Notes (Signed)
Subjective:  Patient ID: Jordan Solis, female    DOB: [birth year],  MRN: [id number]  Chief Complaint  Patient presents with  . Plantar Fasciitis    "its no better"    25 y.o. female presents with the above complaint.  Patient presents for follow-up of bilateral plantar fasciitis the right side as 100% healed however the left side there is still pain associated with it.  She has failed all conservative treatment options including injection orthotics cam boot immobilization at this time she would like to discuss surgical options.  She states her pain is still continuous and has not alleviated with any conservative treatment options.     Review of Systems: Negative except as noted in the HPI. Denies N/V/F/Ch.  Past Medical History:  Diagnosis Date  . Asthma     Current Outpatient Medications:  .  acetaminophen (TYLENOL) 500 MG tablet, Take by mouth., Disp: , Rfl:  .  albuterol (PROVENTIL HFA;VENTOLIN HFA) 108 (90 Base) MCG/ACT inhaler, Inhale 2 puffs into the lungs every 4 (four) hours as needed for wheezing or shortness of breath., Disp: 1 Inhaler, Rfl: 0 .  albuterol (PROVENTIL HFA;VENTOLIN HFA) 108 (90 Base) MCG/ACT inhaler, Inhale 2 puffs into the lungs every 4 (four) hours as needed for wheezing or shortness of breath., Disp: 1 Inhaler, Rfl: 0 .  BREO ELLIPTA 200-25 MCG/INH AEPB, , Disp: , Rfl:  .  cetirizine HCl (ZYRTEC) 1 MG/ML solution, Take by mouth daily., Disp: , Rfl:  .  chlorhexidine (PERIDEX) 0.12 % solution, , Disp: , Rfl:  .  chlorhexidine (PERIDEX) 0.12 % solution, 15 mL by Mouth route Two (2) times a day., Disp: , Rfl:  .  Cholecalciferol (VITAMIN D3) 75 MCG (3000 UT) TABS, Take by mouth., Disp: , Rfl:  .  dicyclomine (BENTYL) 20 MG tablet, Take 1 tablet (20 mg total) by mouth 2 (two) times daily., Disp: 20 tablet, Rfl: 0 .  fluticasone (FLONASE) 50 MCG/ACT nasal spray, Place into the nose., Disp: , Rfl:  .  hydrOXYzine (ATARAX/VISTARIL) 25 MG tablet, Take 1 tablet  (25 mg total) by mouth at bedtime., Disp: 20 tablet, Rfl: 0 .  montelukast (SINGULAIR) 10 MG tablet, Take by mouth., Disp: , Rfl:  .  Multiple Vitamin (QUINTABS) TABS, Take by mouth., Disp: , Rfl:  .  omeprazole (PRILOSEC) 20 MG capsule, Take 20 mg by mouth daily., Disp: , Rfl:  .  ondansetron (ZOFRAN ODT) 8 MG disintegrating tablet, Take 1 tablet (8 mg total) by mouth every 8 (eight) hours as needed for nausea or vomiting., Disp: 20 tablet, Rfl: 0 .  Spacer/Aero-Holding Chambers (AEROCHAMBER PLUS) inhaler, Use as instructed, Disp: 1 each, Rfl: 2  Social History   Tobacco Use  Smoking Status Never Smoker  Smokeless Tobacco Never Used    Allergies  Allergen Reactions  . Penicillins Anaphylaxis  . Shellfish Allergy Anaphylaxis  . Pork Allergy Rash   Objective:   There were no vitals filed for this visit. There is no height or weight on file to calculate BMI. Constitutional Well developed. Well nourished.  Vascular Dorsalis pedis pulses palpable bilaterally. Posterior tibial pulses palpable bilaterally. Capillary refill normal to all digits.  No cyanosis or clubbing noted. Pedal hair growth normal.  Neurologic Normal speech. Oriented to person, place, and time. Epicritic sensation to light touch grossly present bilaterally.  Dermatologic Nails well groomed and normal in appearance. No open wounds. No skin lesions.  Orthopedic: Normal joint ROM without pain or crepitus bilaterally. No visible deformities.  pain to palpation at the left calcaneal tuber bilaterally. No pain with calcaneal squeeze bilaterally. Ankle ROM full range of motion bilaterally. Silfverskiold Test: Positive for left gastrocnemius equinus with Silfverskiold test   Radiographs: Taken and reviewed.  None  Assessment:   1. Gastrocnemius equinus of left lower extremity   2. Plantar fasciitis of left foot   3. Preoperative examination    Plan:  Patient was evaluated and treated and all questions  answered.  Right plantar fasciitis -Clinically healed  Plantar Fasciitis, primary left heel with underlying equinus -Clinically the left heel has not improved whatsoever.  Patient has failed all conservative treatment options at this time she would like to discuss surgical option to help with the left heel pain with underlying gastrocnemius equinus.  I explained to the patient my surgical plan in extensive detail as well as all the postop protocols.  I believe patient will benefit from left gastrocnemius recession with endoscopic plantar fasciotomy.  I discussed my operating plan including incision placement as well as all the risk and complication associated with it.  Patient would like to proceed with the surgery despite all the risks of surgical complications. -Informed surgical risk consent was reviewed and read aloud to the patient.  I reviewed the films.  I have discussed my findings with the patient in great detail.  I have discussed all risks including but not limited to infection, stiffness, scarring, limp, disability, deformity, damage to blood vessels and nerves, numbness, poor healing, need for braces, arthritis, chronic pain, amputation, death.  All benefits and realistic expectations discussed in great detail.  I have made no promises as to the outcome.  I have provided realistic expectations.  I have offered the patient a 2nd opinion, which they have declined and assured me they preferred to proceed despite the risks -A total of 33 minutes was spent in direct patient care as well as pre and post patient encounter activities.  This includes documentation as well as reviewing patient chart for labs, imaging, past medical, surgical, social, and family history as documented in the EMR.  I have reviewed medication allergies as documented in EMR.  I discussed the etiology of condition and treatment options from conservative to surgical care.  All risks and benefit of the treatment course was  discussed in detail.  All questions were answered and return appointment was discussed.  Since the visit completed in an ambulatory/outpatient setting, the patient and/or parent/guardian has been advised to contact the providers office for worsening condition and seek medical treatment and/or call 911 if the patient deems either is necessary.   Pes planus deformity semiflexible -I explained to the patient the etiology of pes planus deformity and various treatment options associated with it.  I believe patient will benefit from orthotics management to help address the arch of the foot as well as control the hindfoot motion.  Patient will be scheduled to see Raiford Noble for custom-made orthotics -Patient has picked up the orthotics and is functioning well without acute problems  No follow-ups on file.

## 2020-04-12 ENCOUNTER — Encounter: Payer: Self-pay | Admitting: *Deleted

## 2020-04-12 DIAGNOSIS — M79676 Pain in unspecified toe(s): Secondary | ICD-10-CM

## 2020-04-15 NOTE — Telephone Encounter (Signed)
Jordan Solis came by the office on 04/12/2020 and the FMLA paperwork was completed and faxed.  It was also faxed to her requested fax number.

## 2020-04-16 ENCOUNTER — Encounter: Payer: Self-pay | Admitting: Podiatry

## 2020-04-22 ENCOUNTER — Telehealth: Payer: Self-pay | Admitting: Urology

## 2020-04-22 NOTE — Telephone Encounter (Signed)
DOS:05/06/20  EPF LEFT --- 40768 GASTROCNEMIUS RECESS LEFT--- [id number]  [employer]BS EFFECTIVE DATE - 02/10/20  PLAN DEDUCTIBLE- $1,500.00 W/ $1,500.00 REMAINING OUT OF POCKET -$5,900.00 W/ $5,806.00 REMAINING COPAY - $0.00 COINSURANCE - 30% PER SERVICE YEAR  NO PRIOR AUTH IS NEEDED FOR CPT CODES 03159 AND [id number] PER BCBS WEBSITE

## 2020-05-03 ENCOUNTER — Telehealth: Payer: Self-pay

## 2020-05-03 NOTE — Telephone Encounter (Signed)
Nalla called to cancel her surgery with Dr. Allena Katz on 05/06/2020. She stated her mom is also having surgery and she needs to help take care of her. She stated she will call me back to rescheduled her surgery. Notified Dr. Allena Katz and Aram Beecham with [facility]

## 2020-05-14 ENCOUNTER — Encounter: Payer: BC Managed Care – PPO | Admitting: Podiatry

## 2020-05-21 ENCOUNTER — Ambulatory Visit: Payer: BC Managed Care – PPO | Admitting: Podiatry

## 2020-05-28 ENCOUNTER — Other Ambulatory Visit: Payer: Self-pay

## 2020-05-28 ENCOUNTER — Encounter: Payer: Self-pay | Admitting: *Deleted

## 2020-05-28 ENCOUNTER — Encounter: Payer: BC Managed Care – PPO | Admitting: Podiatry

## 2020-05-28 ENCOUNTER — Encounter: Payer: Self-pay | Admitting: Podiatry

## 2020-05-28 ENCOUNTER — Ambulatory Visit (INDEPENDENT_AMBULATORY_CARE_PROVIDER_SITE_OTHER): Payer: BC Managed Care – PPO | Admitting: Podiatry

## 2020-05-28 DIAGNOSIS — M21862 Other specified acquired deformities of left lower leg: Secondary | ICD-10-CM

## 2020-05-28 DIAGNOSIS — M722 Plantar fascial fibromatosis: Secondary | ICD-10-CM

## 2020-05-28 DIAGNOSIS — M216X2 Other acquired deformities of left foot: Secondary | ICD-10-CM

## 2020-05-29 ENCOUNTER — Encounter: Payer: Self-pay | Admitting: Podiatry

## 2020-05-29 NOTE — Progress Notes (Signed)
Subjective:  Patient ID: Jordan Solis, female    DOB: [birth year],  MRN: [id number]  Chief Complaint  Patient presents with  . Plantar Fasciitis    "its a lot worse now.  The pain is now going up the back of my leg"    25 y.o. female presents with the above complaint.  Patient presents for follow-up of bilateral plantar fasciitis the right side as 100% healed however the left side there is still pain associated with it.  She has failed all conservative treatment options including injection orthotics cam boot immobilization at this time she would like to discuss surgical options.  She states her pain is still continuous and has not alleviated with any conservative treatment options.  Patient had to reschedule her surgery and would like a steroid injection today while waiting for the surgical date in June.   Review of Systems: Negative except as noted in the HPI. Denies N/V/F/Ch.  Past Medical History:  Diagnosis Date  . Asthma     Current Outpatient Medications:  .  acetaminophen (TYLENOL) 500 MG tablet, Take by mouth., Disp: , Rfl:  .  albuterol (PROVENTIL HFA;VENTOLIN HFA) 108 (90 Base) MCG/ACT inhaler, Inhale 2 puffs into the lungs every 4 (four) hours as needed for wheezing or shortness of breath., Disp: 1 Inhaler, Rfl: 0 .  albuterol (PROVENTIL HFA;VENTOLIN HFA) 108 (90 Base) MCG/ACT inhaler, Inhale 2 puffs into the lungs every 4 (four) hours as needed for wheezing or shortness of breath., Disp: 1 Inhaler, Rfl: 0 .  BREO ELLIPTA 200-25 MCG/INH AEPB, , Disp: , Rfl:  .  cetirizine HCl (ZYRTEC) 1 MG/ML solution, Take by mouth daily., Disp: , Rfl:  .  chlorhexidine (PERIDEX) 0.12 % solution, , Disp: , Rfl:  .  chlorhexidine (PERIDEX) 0.12 % solution, 15 mL by Mouth route Two (2) times a day., Disp: , Rfl:  .  Cholecalciferol (VITAMIN D3) 75 MCG (3000 UT) TABS, Take by mouth., Disp: , Rfl:  .  dicyclomine (BENTYL) 20 MG tablet, Take 1 tablet (20 mg total) by mouth 2 (two) times  daily., Disp: 20 tablet, Rfl: 0 .  fluticasone (FLONASE) 50 MCG/ACT nasal spray, Place into the nose., Disp: , Rfl:  .  hydrOXYzine (ATARAX/VISTARIL) 25 MG tablet, Take 1 tablet (25 mg total) by mouth at bedtime., Disp: 20 tablet, Rfl: 0 .  montelukast (SINGULAIR) 10 MG tablet, Take by mouth., Disp: , Rfl:  .  Multiple Vitamin (QUINTABS) TABS, Take by mouth., Disp: , Rfl:  .  omeprazole (PRILOSEC) 20 MG capsule, Take 20 mg by mouth daily., Disp: , Rfl:  .  ondansetron (ZOFRAN ODT) 8 MG disintegrating tablet, Take 1 tablet (8 mg total) by mouth every 8 (eight) hours as needed for nausea or vomiting., Disp: 20 tablet, Rfl: 0 .  Spacer/Aero-Holding Chambers (AEROCHAMBER PLUS) inhaler, Use as instructed, Disp: 1 each, Rfl: 2  Social History   Tobacco Use  Smoking Status Never Smoker  Smokeless Tobacco Never Used    Allergies  Allergen Reactions  . Penicillins Anaphylaxis  . Shellfish Allergy Anaphylaxis  . Pork Allergy Rash   Objective:   There were no vitals filed for this visit. There is no height or weight on file to calculate BMI. Constitutional Well developed. Well nourished.  Vascular Dorsalis pedis pulses palpable bilaterally. Posterior tibial pulses palpable bilaterally. Capillary refill normal to all digits.  No cyanosis or clubbing noted. Pedal hair growth normal.  Neurologic Normal speech. Oriented to person, place, and time. Epicritic sensation  to light touch grossly present bilaterally.  Dermatologic Nails well groomed and normal in appearance. No open wounds. No skin lesions.  Orthopedic: Normal joint ROM without pain or crepitus bilaterally. No visible deformities.  pain to palpation at the left calcaneal tuber bilaterally. No pain with calcaneal squeeze bilaterally. Ankle ROM full range of motion bilaterally. Silfverskiold Test: Positive for left gastrocnemius equinus with Silfverskiold test   Radiographs: Taken and reviewed.  None  Assessment:   No  diagnosis found. Plan:  Patient was evaluated and treated and all questions answered.  Right plantar fasciitis -Clinically healed  Plantar Fasciitis, primary left heel with underlying equinus -Clinically the left heel has not improved whatsoever.  Patient has failed all conservative treatment options at this time she would like to discuss surgical option to help with the left heel pain with underlying gastrocnemius equinus.  I explained to the patient my surgical plan in extensive detail as well as all the postop protocols.  I believe patient will benefit from left gastrocnemius recession with endoscopic plantar fasciotomy.  I discussed my operating plan including incision placement as well as all the risk and complication associated with it.  Patient would like to proceed with the surgery despite all the risks of surgical complications. -Informed surgical risk consent was reviewed and read aloud to the patient.  I reviewed the films.  I have discussed my findings with the patient in great detail.  I have discussed all risks including but not limited to infection, stiffness, scarring, limp, disability, deformity, damage to blood vessels and nerves, numbness, poor healing, need for braces, arthritis, chronic pain, amputation, death.  All benefits and realistic expectations discussed in great detail.  I have made no promises as to the outcome.  I have provided realistic expectations.  I have offered the patient a 2nd opinion, which they have declined and assured me they preferred to proceed despite the risks -A total of 33 minutes was spent in direct patient care as well as pre and post patient encounter activities.  This includes documentation as well as reviewing patient chart for labs, imaging, past medical, surgical, social, and family history as documented in the EMR.  I have reviewed medication allergies as documented in EMR.  I discussed the etiology of condition and treatment options from  conservative to surgical care.  All risks and benefit of the treatment course was discussed in detail.  All questions were answered and return appointment was discussed.  Since the visit completed in an ambulatory/outpatient setting, the patient and/or parent/guardian has been advised to contact the providers office for worsening condition and seek medical treatment and/or call 911 if the patient deems either is necessary.  -Patient surgery scheduled for beginning of June in the meantime patient will benefit from a steroid injection to the left foot to give her temporary relief.  Patient agrees with the plan like to proceed with a steroid injection -A steroid injection was performed at left heel using 1% plain Lidocaine and 10 mg of Kenalog. This was well tolerated.    Pes planus deformity semiflexible -I explained to the patient the etiology of pes planus deformity and various treatment options associated with it.  I believe patient will benefit from orthotics management to help address the arch of the foot as well as control the hindfoot motion.  Patient will be scheduled to see Raiford Noble for custom-made orthotics -Patient has picked up the orthotics and is functioning well without acute problems  No follow-ups on file.

## 2020-06-21 ENCOUNTER — Telehealth: Payer: Self-pay | Admitting: Urology

## 2020-06-21 NOTE — Telephone Encounter (Signed)
DOS - 07/15/20  EPF LEFT --- 59093 GASTROCNEMIUS RECESS LEFT --- 11216   [employer]BS EFFECTIVE DATE - 02/10/20   PLAN DEDUCTIBLE - $1,500.00 W/ $1,500.00 REMAINING OUT OF POCKET - $5,900.00 W/ $5,618.00 REMAINING COINSURANCE - 30% COPAY - $0.00  NO PRIOR AUTH REQUIRED

## 2020-07-15 ENCOUNTER — Other Ambulatory Visit: Payer: Self-pay | Admitting: Podiatry

## 2020-07-15 ENCOUNTER — Encounter: Payer: Self-pay | Admitting: Podiatry

## 2020-07-15 ENCOUNTER — Telehealth: Payer: Self-pay | Admitting: *Deleted

## 2020-07-15 DIAGNOSIS — M216X2 Other acquired deformities of left foot: Secondary | ICD-10-CM

## 2020-07-15 DIAGNOSIS — M722 Plantar fascial fibromatosis: Secondary | ICD-10-CM | POA: Diagnosis not present

## 2020-07-15 MED ORDER — IBUPROFEN 800 MG PO TABS: 800.0000 mg | ORAL_TABLET | Freq: Four times a day (QID) | ORAL | 1 refills | Status: DC | PRN

## 2020-07-15 MED ORDER — OXYCODONE-ACETAMINOPHEN 5-325 MG PO TABS: 1.0000 | ORAL_TABLET | ORAL | 0 refills | Status: DC | PRN

## 2020-07-15 NOTE — Telephone Encounter (Signed)
Jordan Solis w/ [facility] is needing clarification on a medication sent(Percocet), higher dosage than what they recommend and are unable to fill.Please advise and resend prescription.

## 2020-07-16 NOTE — Telephone Encounter (Signed)
Returned call to pharmacist(Laura), gave authorization to adjust medication per Dr Eliane Decree instructions,verbalized understanding.

## 2020-07-16 NOTE — Telephone Encounter (Signed)
You can allow them to adjust the prescription

## 2020-07-23 ENCOUNTER — Other Ambulatory Visit: Payer: Self-pay

## 2020-07-23 ENCOUNTER — Encounter: Payer: Self-pay | Admitting: *Deleted

## 2020-07-23 ENCOUNTER — Ambulatory Visit (INDEPENDENT_AMBULATORY_CARE_PROVIDER_SITE_OTHER): Payer: BC Managed Care – PPO | Admitting: Podiatry

## 2020-07-23 DIAGNOSIS — M216X2 Other acquired deformities of left foot: Secondary | ICD-10-CM

## 2020-07-23 DIAGNOSIS — M21862 Other specified acquired deformities of left lower leg: Secondary | ICD-10-CM

## 2020-07-23 DIAGNOSIS — M722 Plantar fascial fibromatosis: Secondary | ICD-10-CM

## 2020-07-23 DIAGNOSIS — Z9889 Other specified postprocedural states: Secondary | ICD-10-CM

## 2020-07-23 NOTE — Progress Notes (Signed)
Subjective:  Patient ID: Jordan Solis, female    DOB: May 23, 1995,  MRN: [id number]  Chief Complaint  Patient presents with   Routine Post Op    POST OP DOS 6.6.22    DOS: 07/15/20 Procedure: Left EPF and gastrocnemius recession  25 y.o. female returns for post-op check.  Patient states she is doing well.  She has been doing well on pain control medication.  She is now only taking ibuprofen.  She states that she is try to ambulate with the left side weightbearing as tolerated cam boot.  She denies any other acute complaints.  Review of Systems: Negative except as noted in the HPI. Denies N/V/F/Ch.  Past Medical History:  Diagnosis Date   Asthma     Current Outpatient Medications:    acetaminophen (TYLENOL) 500 MG tablet, Take by mouth., Disp: , Rfl:    albuterol (PROVENTIL HFA;VENTOLIN HFA) 108 (90 Base) MCG/ACT inhaler, Inhale 2 puffs into the lungs every 4 (four) hours as needed for wheezing or shortness of breath., Disp: 1 Inhaler, Rfl: 0   albuterol (PROVENTIL HFA;VENTOLIN HFA) 108 (90 Base) MCG/ACT inhaler, Inhale 2 puffs into the lungs every 4 (four) hours as needed for wheezing or shortness of breath., Disp: 1 Inhaler, Rfl: 0   BREO ELLIPTA 200-25 MCG/INH AEPB, , Disp: , Rfl:    cetirizine HCl (ZYRTEC) 1 MG/ML solution, Take by mouth daily., Disp: , Rfl:    chlorhexidine (PERIDEX) 0.12 % solution, , Disp: , Rfl:    chlorhexidine (PERIDEX) 0.12 % solution, 15 mL by Mouth route Two (2) times a day., Disp: , Rfl:    Cholecalciferol (VITAMIN D3) 75 MCG (3000 UT) TABS, Take by mouth., Disp: , Rfl:    dicyclomine (BENTYL) 20 MG tablet, Take 1 tablet (20 mg total) by mouth 2 (two) times daily., Disp: 20 tablet, Rfl: 0   fluticasone (FLONASE) 50 MCG/ACT nasal spray, Place into the nose., Disp: , Rfl:    hydrOXYzine (ATARAX/VISTARIL) 25 MG tablet, Take 1 tablet (25 mg total) by mouth at bedtime., Disp: 20 tablet, Rfl: 0   ibuprofen (ADVIL) 800 MG tablet, Take 1 tablet (800 mg total)  by mouth every 6 (six) hours as needed., Disp: 60 tablet, Rfl: 1   ibuprofen (ADVIL) 800 MG tablet, Take 1 tablet (800 mg total) by mouth every 6 (six) hours as needed., Disp: 60 tablet, Rfl: 1   montelukast (SINGULAIR) 10 MG tablet, Take by mouth., Disp: , Rfl:    Multiple Vitamin (QUINTABS) TABS, Take by mouth., Disp: , Rfl:    omeprazole (PRILOSEC) 20 MG capsule, Take 20 mg by mouth daily., Disp: , Rfl:    ondansetron (ZOFRAN ODT) 8 MG disintegrating tablet, Take 1 tablet (8 mg total) by mouth every 8 (eight) hours as needed for nausea or vomiting., Disp: 20 tablet, Rfl: 0   oxyCODONE-acetaminophen (PERCOCET) 5-325 MG tablet, Take 1-2 tablets by mouth every 4 (four) hours as needed for severe pain., Disp: 30 tablet, Rfl: 0   oxyCODONE-acetaminophen (PERCOCET) 5-325 MG tablet, Take 1-2 tablets by mouth every 4 (four) hours as needed for severe pain., Disp: 30 tablet, Rfl: 0   Spacer/Aero-Holding Chambers (AEROCHAMBER PLUS) inhaler, Use as instructed, Disp: 1 each, Rfl: 2  Social History   Tobacco Use  Smoking Status Never  Smokeless Tobacco Never    Allergies  Allergen Reactions   Penicillins Anaphylaxis   Shellfish Allergy Anaphylaxis   Pork Allergy Rash   Objective:  There were no vitals filed for this visit.  There is no height or weight on file to calculate BMI. Constitutional Well developed. Well nourished.  Vascular Foot warm and well perfused. Capillary refill normal to all digits.   Neurologic Normal speech. Oriented to person, place, and time. Epicritic sensation to light touch grossly present bilaterally.  Dermatologic Skin healing well without signs of infection. Skin edges well coapted without signs of infection.  Orthopedic: Tenderness to palpation noted about the surgical site.   Radiographs: None Assessment:   1. Gastrocnemius equinus of left lower extremity   2. Plantar fasciitis of left foot   3. Status post foot surgery    Plan:  Patient was evaluated  and treated and all questions answered.  S/p foot surgery left -Progressing as expected post-operatively. -XR: See above -WB Status: Weightbearing as tolerated in cam boot -Sutures: Intact.  No clinical signs of dehiscence.  No clinical signs of infection -Medications: None -Foot redressed.  No follow-ups on file.

## 2020-07-25 ENCOUNTER — Telehealth: Payer: Self-pay | Admitting: *Deleted

## 2020-07-25 ENCOUNTER — Encounter: Payer: Self-pay | Admitting: *Deleted

## 2020-07-25 NOTE — Telephone Encounter (Signed)
"  My job wouldn't accept the letter to return back to work.  They said it needed to be more indepth.  It needs to include the start date and the finish date.  It needs to say my restrictions which is to only work for five hours a day and no lifting over 25 pounds.  It must say how long I should have those restrictions."    I typed the letter.  Patient can pick it up at her convenience.

## 2020-07-26 ENCOUNTER — Encounter: Payer: Self-pay | Admitting: Podiatry

## 2020-08-06 ENCOUNTER — Ambulatory Visit (INDEPENDENT_AMBULATORY_CARE_PROVIDER_SITE_OTHER): Payer: BC Managed Care – PPO | Admitting: Podiatry

## 2020-08-06 ENCOUNTER — Encounter: Payer: Self-pay | Admitting: Podiatry

## 2020-08-06 ENCOUNTER — Other Ambulatory Visit: Payer: Self-pay

## 2020-08-06 ENCOUNTER — Encounter: Payer: BC Managed Care – PPO | Admitting: Podiatry

## 2020-08-06 DIAGNOSIS — M21862 Other specified acquired deformities of left lower leg: Secondary | ICD-10-CM

## 2020-08-06 DIAGNOSIS — Z9889 Other specified postprocedural states: Secondary | ICD-10-CM

## 2020-08-06 DIAGNOSIS — M722 Plantar fascial fibromatosis: Secondary | ICD-10-CM

## 2020-08-06 DIAGNOSIS — M216X2 Other acquired deformities of left foot: Secondary | ICD-10-CM

## 2020-08-07 ENCOUNTER — Encounter: Payer: Self-pay | Admitting: Podiatry

## 2020-08-07 NOTE — Progress Notes (Signed)
Subjective:  Patient ID: Jordan Solis, female    DOB: April 11, 1995,  MRN: [id number]  No chief complaint on file.   DOS: 07/15/20 Procedure: Left EPF and gastrocnemius recession  25 y.o. female returns for post-op check.  Patient states she is doing well.  She has not been taking any pain medication.  She states that she is ambulating is weightbearing as tolerated with the boot.  She would like to discuss next treatment plans.  Review of Systems: Negative except as noted in the HPI. Denies N/V/F/Ch.  Past Medical History:  Diagnosis Date   Asthma     Current Outpatient Medications:    acetaminophen (TYLENOL) 500 MG tablet, Take by mouth., Disp: , Rfl:    albuterol (PROVENTIL HFA;VENTOLIN HFA) 108 (90 Base) MCG/ACT inhaler, Inhale 2 puffs into the lungs every 4 (four) hours as needed for wheezing or shortness of breath., Disp: 1 Inhaler, Rfl: 0   albuterol (PROVENTIL HFA;VENTOLIN HFA) 108 (90 Base) MCG/ACT inhaler, Inhale 2 puffs into the lungs every 4 (four) hours as needed for wheezing or shortness of breath., Disp: 1 Inhaler, Rfl: 0   BREO ELLIPTA 200-25 MCG/INH AEPB, , Disp: , Rfl:    cetirizine HCl (ZYRTEC) 1 MG/ML solution, Take by mouth daily., Disp: , Rfl:    chlorhexidine (PERIDEX) 0.12 % solution, , Disp: , Rfl:    chlorhexidine (PERIDEX) 0.12 % solution, 15 mL by Mouth route Two (2) times a day., Disp: , Rfl:    Cholecalciferol (VITAMIN D3) 75 MCG (3000 UT) TABS, Take by mouth., Disp: , Rfl:    dicyclomine (BENTYL) 20 MG tablet, Take 1 tablet (20 mg total) by mouth 2 (two) times daily., Disp: 20 tablet, Rfl: 0   fluticasone (FLONASE) 50 MCG/ACT nasal spray, Place into the nose., Disp: , Rfl:    hydrOXYzine (ATARAX/VISTARIL) 25 MG tablet, Take 1 tablet (25 mg total) by mouth at bedtime., Disp: 20 tablet, Rfl: 0   ibuprofen (ADVIL) 800 MG tablet, Take 1 tablet (800 mg total) by mouth every 6 (six) hours as needed., Disp: 60 tablet, Rfl: 1   ibuprofen (ADVIL) 800 MG tablet,  Take 1 tablet (800 mg total) by mouth every 6 (six) hours as needed., Disp: 60 tablet, Rfl: 1   montelukast (SINGULAIR) 10 MG tablet, Take by mouth., Disp: , Rfl:    Multiple Vitamin (QUINTABS) TABS, Take by mouth., Disp: , Rfl:    omeprazole (PRILOSEC) 20 MG capsule, Take 20 mg by mouth daily., Disp: , Rfl:    ondansetron (ZOFRAN ODT) 8 MG disintegrating tablet, Take 1 tablet (8 mg total) by mouth every 8 (eight) hours as needed for nausea or vomiting., Disp: 20 tablet, Rfl: 0   oxyCODONE-acetaminophen (PERCOCET) 5-325 MG tablet, Take 1-2 tablets by mouth every 4 (four) hours as needed for severe pain., Disp: 30 tablet, Rfl: 0   oxyCODONE-acetaminophen (PERCOCET) 5-325 MG tablet, Take 1-2 tablets by mouth every 4 (four) hours as needed for severe pain., Disp: 30 tablet, Rfl: 0   Spacer/Aero-Holding Chambers (AEROCHAMBER PLUS) inhaler, Use as instructed, Disp: 1 each, Rfl: 2  Social History   Tobacco Use  Smoking Status Never  Smokeless Tobacco Never    Allergies  Allergen Reactions   Penicillins Anaphylaxis   Shellfish Allergy Anaphylaxis   Pork Allergy Rash   Objective:  There were no vitals filed for this visit. There is no height or weight on file to calculate BMI. Constitutional Well developed. Well nourished.  Vascular Foot warm and well perfused. Capillary  refill normal to all digits.   Neurologic Normal speech. Oriented to person, place, and time. Epicritic sensation to light touch grossly present bilaterally.  Dermatologic Skin completely epithelialized.  No dehiscence noted no clinical signs of infection noted.  Improved range of motion at the ankle joint as well as no taunt plantar fascia noted  Orthopedic: Tenderness to palpation noted about the surgical site.   Radiographs: None Assessment:   1. Gastrocnemius equinus of left lower extremity   2. Plantar fasciitis of left foot   3. Status post foot surgery     Plan:  Patient was evaluated and treated and all  questions answered.  S/p foot surgery left -Progressing as expected post-operatively. -XR: See above -WB Status: Weightbearing as tolerated in cam boot -Sutures: Removed no clinical signs of dehiscence.  No clinical signs of infection -Medications: None -Educated her to begin physical therapy.  Prescription for physical therapy was given.  No follow-ups on file.

## 2020-08-08 ENCOUNTER — Encounter: Payer: BC Managed Care – PPO | Admitting: Podiatry

## 2020-09-17 ENCOUNTER — Encounter: Payer: Self-pay | Admitting: Podiatry

## 2020-09-17 ENCOUNTER — Other Ambulatory Visit: Payer: Self-pay

## 2020-09-17 ENCOUNTER — Ambulatory Visit (INDEPENDENT_AMBULATORY_CARE_PROVIDER_SITE_OTHER): Payer: BC Managed Care – PPO | Admitting: Podiatry

## 2020-09-17 DIAGNOSIS — M21862 Other specified acquired deformities of left lower leg: Secondary | ICD-10-CM

## 2020-09-17 DIAGNOSIS — M216X2 Other acquired deformities of left foot: Secondary | ICD-10-CM

## 2020-09-17 DIAGNOSIS — M722 Plantar fascial fibromatosis: Secondary | ICD-10-CM

## 2020-09-17 DIAGNOSIS — Z9889 Other specified postprocedural states: Secondary | ICD-10-CM

## 2020-09-17 NOTE — Progress Notes (Signed)
Subjective:  Patient ID: Jordan Solis, female    DOB: 09-16-1995,  MRN: [id number]  Chief Complaint  Patient presents with   Routine Post Op    "It's doing better.  I started physical therapy this week.  I had a fall and it hurt really bad for a while."     DOS: 07/15/20 Procedure: Left EPF and gastrocnemius recession  24 y.o. female returns for post-op check.  Patient states she is doing well.  She has not been taking any pain medication.  She states that she is ambulating is weightbearing as tolerated with the boot.  She would like to discuss next treatment plans.  Review of Systems: Negative except as noted in the HPI. Denies N/V/F/Ch.  Past Medical History:  Diagnosis Date   Asthma     Current Outpatient Medications:    acetaminophen (TYLENOL) 500 MG tablet, Take by mouth., Disp: , Rfl:    albuterol (PROVENTIL HFA;VENTOLIN HFA) 108 (90 Base) MCG/ACT inhaler, Inhale 2 puffs into the lungs every 4 (four) hours as needed for wheezing or shortness of breath., Disp: 1 Inhaler, Rfl: 0   albuterol (PROVENTIL HFA;VENTOLIN HFA) 108 (90 Base) MCG/ACT inhaler, Inhale 2 puffs into the lungs every 4 (four) hours as needed for wheezing or shortness of breath., Disp: 1 Inhaler, Rfl: 0   BREO ELLIPTA 200-25 MCG/INH AEPB, , Disp: , Rfl:    cetirizine HCl (ZYRTEC) 1 MG/ML solution, Take by mouth daily., Disp: , Rfl:    Cholecalciferol (VITAMIN D3) 75 MCG (3000 UT) TABS, Take by mouth., Disp: , Rfl:    fluticasone (FLONASE) 50 MCG/ACT nasal spray, Place into the nose., Disp: , Rfl:    ibuprofen (ADVIL) 800 MG tablet, Take 1 tablet (800 mg total) by mouth every 6 (six) hours as needed., Disp: 60 tablet, Rfl: 1   ibuprofen (ADVIL) 800 MG tablet, Take 1 tablet (800 mg total) by mouth every 6 (six) hours as needed., Disp: 60 tablet, Rfl: 1   montelukast (SINGULAIR) 10 MG tablet, Take by mouth., Disp: , Rfl:    Multiple Vitamin (QUINTABS) TABS, Take by mouth., Disp: , Rfl:    Spacer/Aero-Holding  Chambers (AEROCHAMBER PLUS) inhaler, Use as instructed, Disp: 1 each, Rfl: 2   chlorhexidine (PERIDEX) 0.12 % solution, , Disp: , Rfl:    chlorhexidine (PERIDEX) 0.12 % solution, 15 mL by Mouth route Two (2) times a day. (Patient not taking: Reported on 09/17/2020), Disp: , Rfl:    dicyclomine (BENTYL) 20 MG tablet, Take 1 tablet (20 mg total) by mouth 2 (two) times daily. (Patient not taking: Reported on 09/17/2020), Disp: 20 tablet, Rfl: 0   hydrOXYzine (ATARAX/VISTARIL) 25 MG tablet, Take 1 tablet (25 mg total) by mouth at bedtime. (Patient not taking: Reported on 09/17/2020), Disp: 20 tablet, Rfl: 0   omeprazole (PRILOSEC) 20 MG capsule, Take 20 mg by mouth daily. (Patient not taking: Reported on 09/17/2020), Disp: , Rfl:    ondansetron (ZOFRAN ODT) 8 MG disintegrating tablet, Take 1 tablet (8 mg total) by mouth every 8 (eight) hours as needed for nausea or vomiting. (Patient not taking: Reported on 09/17/2020), Disp: 20 tablet, Rfl: 0   oxyCODONE-acetaminophen (PERCOCET) 5-325 MG tablet, Take 1-2 tablets by mouth every 4 (four) hours as needed for severe pain. (Patient not taking: Reported on 09/17/2020), Disp: 30 tablet, Rfl: 0   oxyCODONE-acetaminophen (PERCOCET) 5-325 MG tablet, Take 1-2 tablets by mouth every 4 (four) hours as needed for severe pain. (Patient not taking: Reported on 09/17/2020), Disp:  30 tablet, Rfl: 0  Social History   Tobacco Use  Smoking Status Never  Smokeless Tobacco Never    Allergies  Allergen Reactions   Penicillins Anaphylaxis   Shellfish Allergy Anaphylaxis   Pork Allergy Rash   Objective:  There were no vitals filed for this visit. There is no height or weight on file to calculate BMI. Constitutional Well developed. Well nourished.  Vascular Foot warm and well perfused. Capillary refill normal to all digits.   Neurologic Normal speech. Oriented to person, place, and time. Epicritic sensation to light touch grossly present bilaterally.  Dermatologic Skin  completely epithelialized.  No dehiscence noted no clinical signs of infection noted.  Improved range of motion at the ankle joint as well as no taunt plantar fascia noted  Orthopedic: Mild tenderness to palpation noted about the surgical site.   Radiographs: None Assessment:   No diagnosis found.   Plan:  Patient was evaluated and treated and all questions answered.  S/p foot surgery left -Progressing as expected post-operatively. -XR: See above -WB Status: Weightbearing as tolerated in cam boot -Sutures: Removed no clinical signs of dehiscence.  No clinical signs of infection -Medications: None -Continue physical therapy -Work note was dispensed giving and limiting her to 5 hours a day.  No follow-ups on file.

## 2020-10-22 ENCOUNTER — Ambulatory Visit: Payer: BC Managed Care – PPO | Admitting: Podiatry

## 2020-10-24 ENCOUNTER — Other Ambulatory Visit: Payer: Self-pay

## 2020-10-24 ENCOUNTER — Encounter: Payer: Self-pay | Admitting: *Deleted

## 2020-10-24 ENCOUNTER — Ambulatory Visit (INDEPENDENT_AMBULATORY_CARE_PROVIDER_SITE_OTHER): Payer: BC Managed Care – PPO | Admitting: Podiatry

## 2020-10-24 DIAGNOSIS — M216X2 Other acquired deformities of left foot: Secondary | ICD-10-CM

## 2020-10-24 DIAGNOSIS — M722 Plantar fascial fibromatosis: Secondary | ICD-10-CM

## 2020-10-24 DIAGNOSIS — M21862 Other specified acquired deformities of left lower leg: Secondary | ICD-10-CM

## 2020-10-25 NOTE — Progress Notes (Signed)
Subjective:  Patient ID: Jordan Solis, female    DOB: 1996/01/28,  MRN: [id number]  Chief Complaint  Patient presents with   Routine Post Op    PT stated that she is doing a lot better      DOS: 07/15/20 Procedure: Left EPF and gastrocnemius recession  25 y.o. female returns for post-op check.  Patient states she is doing well.  She has not been taking any pain medication.  She states the physical therapy is going really well.  She is doing a lot better.  She denies any other acute complaints  Review of Systems: Negative except as noted in the HPI. Denies N/V/F/Ch.  Past Medical History:  Diagnosis Date   Asthma     Current Outpatient Medications:    acetaminophen (TYLENOL) 500 MG tablet, Take by mouth., Disp: , Rfl:    albuterol (PROVENTIL HFA;VENTOLIN HFA) 108 (90 Base) MCG/ACT inhaler, Inhale 2 puffs into the lungs every 4 (four) hours as needed for wheezing or shortness of breath., Disp: 1 Inhaler, Rfl: 0   albuterol (PROVENTIL HFA;VENTOLIN HFA) 108 (90 Base) MCG/ACT inhaler, Inhale 2 puffs into the lungs every 4 (four) hours as needed for wheezing or shortness of breath., Disp: 1 Inhaler, Rfl: 0   BREO ELLIPTA 200-25 MCG/INH AEPB, , Disp: , Rfl:    cetirizine HCl (ZYRTEC) 1 MG/ML solution, Take by mouth daily., Disp: , Rfl:    chlorhexidine (PERIDEX) 0.12 % solution, , Disp: , Rfl:    chlorhexidine (PERIDEX) 0.12 % solution, 15 mL by Mouth route Two (2) times a day. (Patient not taking: Reported on 09/17/2020), Disp: , Rfl:    Cholecalciferol (VITAMIN D3) 75 MCG (3000 UT) TABS, Take by mouth., Disp: , Rfl:    dicyclomine (BENTYL) 20 MG tablet, Take 1 tablet (20 mg total) by mouth 2 (two) times daily. (Patient not taking: Reported on 09/17/2020), Disp: 20 tablet, Rfl: 0   fluticasone (FLONASE) 50 MCG/ACT nasal spray, Place into the nose., Disp: , Rfl:    hydrOXYzine (ATARAX/VISTARIL) 25 MG tablet, Take 1 tablet (25 mg total) by mouth at bedtime. (Patient not taking: Reported on  09/17/2020), Disp: 20 tablet, Rfl: 0   ibuprofen (ADVIL) 800 MG tablet, Take 1 tablet (800 mg total) by mouth every 6 (six) hours as needed., Disp: 60 tablet, Rfl: 1   ibuprofen (ADVIL) 800 MG tablet, Take 1 tablet (800 mg total) by mouth every 6 (six) hours as needed., Disp: 60 tablet, Rfl: 1   montelukast (SINGULAIR) 10 MG tablet, Take by mouth., Disp: , Rfl:    Multiple Vitamin (QUINTABS) TABS, Take by mouth., Disp: , Rfl:    omeprazole (PRILOSEC) 20 MG capsule, Take 20 mg by mouth daily. (Patient not taking: Reported on 09/17/2020), Disp: , Rfl:    ondansetron (ZOFRAN ODT) 8 MG disintegrating tablet, Take 1 tablet (8 mg total) by mouth every 8 (eight) hours as needed for nausea or vomiting. (Patient not taking: Reported on 09/17/2020), Disp: 20 tablet, Rfl: 0   oxyCODONE-acetaminophen (PERCOCET) 5-325 MG tablet, Take 1-2 tablets by mouth every 4 (four) hours as needed for severe pain. (Patient not taking: Reported on 09/17/2020), Disp: 30 tablet, Rfl: 0   oxyCODONE-acetaminophen (PERCOCET) 5-325 MG tablet, Take 1-2 tablets by mouth every 4 (four) hours as needed for severe pain. (Patient not taking: Reported on 09/17/2020), Disp: 30 tablet, Rfl: 0   Spacer/Aero-Holding Chambers (AEROCHAMBER PLUS) inhaler, Use as instructed, Disp: 1 each, Rfl: 2  Social History   Tobacco Use  Smoking Status Never  Smokeless Tobacco Never    Allergies  Allergen Reactions   Penicillins Anaphylaxis   Shellfish Allergy Anaphylaxis   Pork Allergy Rash   Objective:  There were no vitals filed for this visit. There is no height or weight on file to calculate BMI. Constitutional Well developed. Well nourished.  Vascular Foot warm and well perfused. Capillary refill normal to all digits.   Neurologic Normal speech. Oriented to person, place, and time. Epicritic sensation to light touch grossly present bilaterally.  Dermatologic Skin completely epithelialized.  No dehiscence noted no clinical signs of infection  noted.  Improved range of motion at the ankle joint as well as no taunt plantar fascia noted  Orthopedic: Mild tenderness to palpation noted about the surgical site.   Radiographs: None Assessment:   1. Gastrocnemius equinus of left lower extremity   2. Plantar fasciitis of left foot      Plan:  Patient was evaluated and treated and all questions answered.  S/p foot surgery left -Progressing as expected post-operatively. -XR: See above -WB Status: Weightbearing as tolerated in cam boot -Sutures: Removed no clinical signs of dehiscence.  No clinical signs of infection -Medications: None -Continue physical therapy -Work note was dispensed giving and limiting her to 5 hours a day.  No follow-ups on file.

## 2020-12-19 ENCOUNTER — Other Ambulatory Visit: Payer: Self-pay

## 2020-12-19 ENCOUNTER — Encounter: Payer: BC Managed Care – PPO | Admitting: Podiatry

## 2020-12-19 ENCOUNTER — Ambulatory Visit (INDEPENDENT_AMBULATORY_CARE_PROVIDER_SITE_OTHER): Payer: BC Managed Care – PPO | Admitting: Podiatry

## 2020-12-19 DIAGNOSIS — R252 Cramp and spasm: Secondary | ICD-10-CM

## 2020-12-19 MED ORDER — CYCLOBENZAPRINE HCL 10 MG PO TABS: 10.0000 mg | ORAL_TABLET | Freq: Three times a day (TID) | ORAL | 0 refills | Status: AC | PRN

## 2020-12-19 NOTE — Progress Notes (Signed)
Subjective:  Patient ID: Jordan Solis, female    DOB: 02/11/1995,  MRN: [id number]  Chief Complaint  Patient presents with   Routine Post Op    DOS 6.6.22      DOS: 07/15/20 Procedure: Left EPF and gastrocnemius recession  25 y.o. female returns for post-op check.  Patient states she is doing well.  The surgical site is doing well.  She has a primary complaint of left forefoot muscle spasm.  She states it happens at nighttime she started noticing more more he wakes her up.  She states it gets painful after the muscle really cramps up.  She has to get up and work it out to get it back together.  She denies any other acute complaints.  Review of Systems: Negative except as noted in the HPI. Denies N/V/F/Ch.  Past Medical History:  Diagnosis Date   Asthma     Current Outpatient Medications:    cyclobenzaprine (FLEXERIL) 10 MG tablet, Take 1 tablet (10 mg total) by mouth 3 (three) times daily as needed for muscle spasms., Disp: 30 tablet, Rfl: 0   acetaminophen (TYLENOL) 500 MG tablet, Take by mouth., Disp: , Rfl:    albuterol (PROVENTIL HFA;VENTOLIN HFA) 108 (90 Base) MCG/ACT inhaler, Inhale 2 puffs into the lungs every 4 (four) hours as needed for wheezing or shortness of breath., Disp: 1 Inhaler, Rfl: 0   albuterol (PROVENTIL HFA;VENTOLIN HFA) 108 (90 Base) MCG/ACT inhaler, Inhale 2 puffs into the lungs every 4 (four) hours as needed for wheezing or shortness of breath., Disp: 1 Inhaler, Rfl: 0   BREO ELLIPTA 200-25 MCG/INH AEPB, , Disp: , Rfl:    cetirizine HCl (ZYRTEC) 1 MG/ML solution, Take by mouth daily., Disp: , Rfl:    chlorhexidine (PERIDEX) 0.12 % solution, , Disp: , Rfl:    chlorhexidine (PERIDEX) 0.12 % solution, 15 mL by Mouth route Two (2) times a day. (Patient not taking: Reported on 09/17/2020), Disp: , Rfl:    Cholecalciferol (VITAMIN D3) 75 MCG (3000 UT) TABS, Take by mouth., Disp: , Rfl:    dicyclomine (BENTYL) 20 MG tablet, Take 1 tablet (20 mg total) by mouth 2  (two) times daily. (Patient not taking: Reported on 09/17/2020), Disp: 20 tablet, Rfl: 0   fluticasone (FLONASE) 50 MCG/ACT nasal spray, Place into the nose., Disp: , Rfl:    hydrOXYzine (ATARAX/VISTARIL) 25 MG tablet, Take 1 tablet (25 mg total) by mouth at bedtime. (Patient not taking: Reported on 09/17/2020), Disp: 20 tablet, Rfl: 0   ibuprofen (ADVIL) 800 MG tablet, Take 1 tablet (800 mg total) by mouth every 6 (six) hours as needed., Disp: 60 tablet, Rfl: 1   ibuprofen (ADVIL) 800 MG tablet, Take 1 tablet (800 mg total) by mouth every 6 (six) hours as needed., Disp: 60 tablet, Rfl: 1   montelukast (SINGULAIR) 10 MG tablet, Take by mouth., Disp: , Rfl:    Multiple Vitamin (QUINTABS) TABS, Take by mouth., Disp: , Rfl:    omeprazole (PRILOSEC) 20 MG capsule, Take 20 mg by mouth daily. (Patient not taking: Reported on 09/17/2020), Disp: , Rfl:    ondansetron (ZOFRAN ODT) 8 MG disintegrating tablet, Take 1 tablet (8 mg total) by mouth every 8 (eight) hours as needed for nausea or vomiting. (Patient not taking: Reported on 09/17/2020), Disp: 20 tablet, Rfl: 0   oxyCODONE-acetaminophen (PERCOCET) 5-325 MG tablet, Take 1-2 tablets by mouth every 4 (four) hours as needed for severe pain. (Patient not taking: Reported on 09/17/2020), Disp: 30 tablet,  Rfl: 0   oxyCODONE-acetaminophen (PERCOCET) 5-325 MG tablet, Take 1-2 tablets by mouth every 4 (four) hours as needed for severe pain. (Patient not taking: Reported on 09/17/2020), Disp: 30 tablet, Rfl: 0   Spacer/Aero-Holding Chambers (AEROCHAMBER PLUS) inhaler, Use as instructed, Disp: 1 each, Rfl: 2  Social History   Tobacco Use  Smoking Status Never  Smokeless Tobacco Never    Allergies  Allergen Reactions   Penicillins Anaphylaxis   Shellfish Allergy Anaphylaxis   Pork Allergy Rash   Objective:  There were no vitals filed for this visit. There is no height or weight on file to calculate BMI. Constitutional Well developed. Well nourished.  Vascular  Foot warm and well perfused. Capillary refill normal to all digits.   Neurologic Normal speech. Oriented to person, place, and time. Epicritic sensation to light touch grossly present bilaterally.  Dermatologic Skin completely epithelialized.  No dehiscence noted no clinical signs of infection noted.  Improved range of motion at the ankle joint as well as no taunt plantar fascia noted  Unable to recreate the muscle spasm during today's clinical visit  Orthopedic: Mild tenderness to palpation noted about the surgical site.   Radiographs: None Assessment:   1. Muscle cramp   2. Foot cramps       Plan:  Patient was evaluated and treated and all questions answered.  S/p foot surgery left -Clinically healed and patient does not have any complaints from the surgical site.  Patient is officially discharged from my care.  Left plantar muscle spasm/cramp -I explained the patient the etiology of muscle spasm in the posterior versus anterior muscle group and correlated with leading to muscle spasm and cramp.  I discussed with the patient that at nighttime is very common especially when her foot is in a plantarflexed position for her to take a comparative advantage of the point plantar muscles against dorsal muscles. -Flexible was dispensed -Also encouraged her to place herself in night splint to foot hold the foot at a 90 degree angle  No follow-ups on file.

## 2020-12-24 ENCOUNTER — Encounter: Payer: BC Managed Care – PPO | Admitting: Podiatry

## 2021-05-21 ENCOUNTER — Encounter (HOSPITAL_COMMUNITY): Payer: Self-pay

## 2021-05-21 ENCOUNTER — Ambulatory Visit (HOSPITAL_COMMUNITY)
Admission: EM | Admit: 2021-05-21 | Discharge: 2021-05-21 | Disposition: A | Discharge status: Home / Self Care | Payer: Federal, State, Local not specified - PPO | Attending: Family Medicine | Admitting: Family Medicine

## 2021-05-21 DIAGNOSIS — T7840XA Allergy, unspecified, initial encounter: Secondary | ICD-10-CM

## 2021-05-21 MED ORDER — DIPHENHYDRAMINE HCL 25 MG PO CAPS
ORAL_CAPSULE | ORAL | Status: AC
  Filled 2021-05-21: qty 1

## 2021-05-21 MED ORDER — FAMOTIDINE 20 MG PO TABS: 20.0000 mg | ORAL_TABLET | Freq: Two times a day (BID) | ORAL | 0 refills | Status: DC

## 2021-05-21 MED ORDER — TRIAMCINOLONE ACETONIDE 40 MG/ML IJ SUSP
INTRAMUSCULAR | Status: AC
  Filled 2021-05-21: qty 1

## 2021-05-21 MED ORDER — DIPHENHYDRAMINE HCL 25 MG PO CAPS
25.0000 mg | ORAL_CAPSULE | Freq: Once | ORAL | Status: AC
  Administered 2021-05-21: 25 mg via ORAL

## 2021-05-21 MED ORDER — FAMOTIDINE 20 MG PO TABS
20.0000 mg | ORAL_TABLET | Freq: Once | ORAL | Status: AC
  Administered 2021-05-21: 20 mg via ORAL

## 2021-05-21 MED ORDER — TRIAMCINOLONE ACETONIDE 40 MG/ML IJ SUSP
40.0000 mg | Freq: Once | INTRAMUSCULAR | Status: AC
  Administered 2021-05-21: 40 mg via INTRAMUSCULAR

## 2021-05-21 MED ORDER — FAMOTIDINE 20 MG PO TABS
ORAL_TABLET | ORAL | Status: AC
  Filled 2021-05-21: qty 1

## 2021-05-21 MED ORDER — PREDNISONE 20 MG PO TABS: 40.0000 mg | ORAL_TABLET | Freq: Every day | ORAL | 0 refills | Status: AC

## 2021-05-21 MED ORDER — ALBUTEROL SULFATE HFA 108 (90 BASE) MCG/ACT IN AERS
2.0000 | INHALATION_SPRAY | RESPIRATORY_TRACT | 0 refills | Status: AC | PRN
Start: 2021-05-21 — End: ?

## 2021-05-21 NOTE — ED Provider Notes (Signed)
?[facility] ? ? ? ?CSN: [id number] ?Arrival date & time: 05/21/21  1644 ? ? ?  ? ?History   ?Chief Complaint ?Chief Complaint  ?Patient presents with  ? Allergic Reaction  ? ? ?HPI ?Huma OAKLEE ESTHER is a 26 y.o. female.  ? ? ?Allergic Reaction ?Here for h/o tightness in throat that began this afternoon, at approx 1600, now time is 1853. No wheezing or new cough. Maybe tongue feels a little swollen? No rash or hives. She has had this happen before. ? ?She does have a h/o asthma ?Past Medical History:  ?Diagnosis Date  ? Asthma   ? ? ?Patient Active Problem List  ? Diagnosis Date Noted  ? Allergic rhinitis 01/04/2018  ? Migraine 01/04/2018  ? Abnormal uterine bleeding (AUB) 05/01/2016  ? Scoliosis of thoracolumbar spine 05/01/2016  ? Vitamin D deficiency 05/01/2016  ? Unspecified asthma, uncomplicated 08/16/2012  ? ? ?Past Surgical History:  ?Procedure Laterality Date  ? NO PAST SURGERIES    ? ? ?OB History   ?No obstetric history on file. ?  ? ? ? ?Home Medications   ? ?Prior to Admission medications   ?Medication Sig Start Date End Date Taking? Authorizing Provider  ?famotidine (PEPCID) 20 MG tablet Take 1 tablet (20 mg total) by mouth 2 (two) times daily. 05/21/21  Yes Zenia Resides, MD  ?predniSONE (DELTASONE) 20 MG tablet Take 2 tablets (40 mg total) by mouth daily with breakfast for 5 days. 05/21/21 05/26/21 Yes Zenia Resides, MD  ?acetaminophen (TYLENOL) 500 MG tablet Take by mouth. 11/09/14   [provider]  ?albuterol (VENTOLIN HFA) 108 (90 Base) MCG/ACT inhaler Inhale 2 puffs into the lungs every 4 (four) hours as needed for wheezing or shortness of breath. 05/21/21   Zenia Resides, MD  ?Earlie Server 409-81 MCG/INH AEPB  10/21/18   [provider]  ?cetirizine HCl (ZYRTEC) 1 MG/ML solution Take by mouth daily.    [provider]  ?chlorhexidine (PERIDEX) 0.12 % solution  09/26/18   [provider]  ?chlorhexidine (PERIDEX) 0.12 % solution 15 mL by Mouth  route Two (2) times a day. ?Patient not taking: Reported on 09/17/2020 09/26/18   [provider]  ?Cholecalciferol (VITAMIN D3) 75 MCG (3000 UT) TABS Take by mouth.    [provider]  ?cyclobenzaprine (FLEXERIL) 10 MG tablet Take 1 tablet (10 mg total) by mouth 3 (three) times daily as needed for muscle spasms. 12/19/20   Candelaria Stagers, DPM  ?fluticasone (FLONASE) 50 MCG/ACT nasal spray Place into the nose. 08/23/15   [provider]  ?montelukast (SINGULAIR) 10 MG tablet Take by mouth. 07/10/15   [provider]  ?Multiple Vitamin (QUINTABS) TABS Take by mouth.    [provider]  ?omeprazole (PRILOSEC) 20 MG capsule Take 20 mg by mouth daily. ?Patient not taking: Reported on 09/17/2020 09/15/18   [provider]  ?ondansetron (ZOFRAN ODT) 8 MG disintegrating tablet Take 1 tablet (8 mg total) by mouth every 8 (eight) hours as needed for nausea or vomiting. ?Patient not taking: Reported on 09/17/2020 01/31/20   Becky Augusta, NP  ?Spacer/Aero-Holding Chambers (AEROCHAMBER PLUS) inhaler Use as instructed 05/04/17   Domenick Gong, MD  ? ? ?Family History ?Family History  ?Problem Relation Age of Onset  ? Healthy Mother   ? Healthy Father   ? ? ?Social History ?Social History  ? ?Tobacco Use  ? Smoking status: Never  ? Smokeless tobacco: Never  ?Vaping Use  ?  Vaping Use: Never used  ?Substance Use Topics  ? Alcohol use: No  ? Drug use: No  ? ? ? ?Allergies   ?Penicillins, Shellfish allergy, and Pork allergy ? ? ?Review of Systems ?Review of Systems ? ? ?Physical Exam ?Triage Vital Signs ?ED Triage Vitals  ?Enc Vitals Group  ?   BP 05/21/21 1651 127/78  ?   Pulse Rate 05/21/21 1651 75  ?   Resp 05/21/21 1651 16  ?   Temp 05/21/21 1651 98.3 ?F (36.8 ?C)  ?   Temp Source 05/21/21 1651 Oral  ?   SpO2 05/21/21 1651 99 %  ?   Weight --   ?   Height --   ?   Head Circumference --   ?   Peak Flow --   ?   Pain Score 05/21/21 1652 0  ?   Pain Loc --   ?   Pain Edu? --   ?   Excl.  in GC? --   ? ?No data found. ? ?Updated Vital Signs ?BP 127/78 (BP Location: Left Arm)   Pulse 75   Temp 98.3 ?F (36.8 ?C) (Oral)   Resp 16   SpO2 99%  ? ?Visual Acuity ?Right Eye Distance:   ?Left Eye Distance:   ?Bilateral Distance:   ? ?Right Eye Near:   ?Left Eye Near:    ?Bilateral Near:    ? ?Physical Exam ?Vitals reviewed.  ?Constitutional:   ?   General: She is not in acute distress. ?   Appearance: She is not toxic-appearing.  ?HENT:  ?   Nose: Nose normal.  ?   Mouth/Throat:  ?   Mouth: Mucous membranes are moist.  ?   Pharynx: No oropharyngeal exudate or posterior oropharyngeal erythema.  ?Eyes:  ?   Extraocular Movements: Extraocular movements intact.  ?   Conjunctiva/sclera: Conjunctivae normal.  ?   Pupils: Pupils are equal, round, and reactive to light.  ?Cardiovascular:  ?   Rate and Rhythm: Normal rate and regular rhythm.  ?   Heart sounds: No murmur heard. ?Pulmonary:  ?   Effort: Pulmonary effort is normal. No respiratory distress.  ?   Breath sounds: Normal breath sounds. No stridor. No wheezing, rhonchi or rales.  ?Chest:  ?   Chest wall: No tenderness.  ?Musculoskeletal:  ?   Cervical back: Neck supple.  ?   Right lower leg: No edema.  ?   Left lower leg: No edema.  ?Lymphadenopathy:  ?   Cervical: No cervical adenopathy.  ?Skin: ?   Capillary Refill: Capillary refill takes less than 2 seconds.  ?   Coloration: Skin is not jaundiced or pale.  ?   Findings: No rash.  ?Neurological:  ?   General: No focal deficit present.  ?   Mental Status: She is alert and oriented to person, place, and time.  ?Psychiatric:     ?   Behavior: Behavior normal.  ? ? ? ?UC Treatments / Results  ?Labs ?(all labs ordered are listed, but only abnormal results are displayed) ?Labs Reviewed - No data to display ? ?EKG ? ? ?Radiology ?No results found. ? ?Procedures ?Procedures (including critical care time) ? ?Medications Ordered in UC ?Medications  ?triamcinolone acetonide (KENALOG-40) injection 40 mg (40 mg  Intramuscular Given 05/21/21 1900)  ?diphenhydrAMINE (BENADRYL) capsule 25 mg (25 mg Oral Given 05/21/21 1900)  ?famotidine (PEPCID) tablet 20 mg (20 mg Oral Given 05/21/21 1900)  ? ? ?Initial Impression / Assessment  and Plan / UC Course  ?I have reviewed the triage vital signs and the nursing notes. ? ?Pertinent labs & imaging results that were available during my care of the patient were reviewed by me and considered in my medical decision making (see chart for details). ? ?  ? ?She feels better by 30 minutes after above meds were given. Will send her in 5 days of prednisone and benadryl and have her take zyrtec too. Will also send in an inhaler for her at her request. ?Final Clinical Impressions(s) / UC Diagnoses  ? ?Final diagnoses:  ?Allergic reaction, initial encounter  ? ? ? ?Discharge Instructions   ? ?  ?You were given a shot of triamcinolone tonight. Also a dose of famotidine 20 mg and benadryl 25 mg. ? ?Take famotidine 20 mg 1 tab twice daily for a few days ? ?Take prednisone 20 mg --2 daily for 5 days. ? ?Take zyrtec/cetirizine 10 mg 1 daily for allergy ? ?Use your inhaler albuterol 2 puffs every 4 hours as needed for wheezing/shortness of breath ? ? ? ? ?ED Prescriptions   ? ? Medication Sig Dispense Auth. Provider  ? albuterol (VENTOLIN HFA) 108 (90 Base) MCG/ACT inhaler Inhale 2 puffs into the lungs every 4 (four) hours as needed for wheezing or shortness of breath. 1 each Zenia Resides, MD  ? famotidine (PEPCID) 20 MG tablet Take 1 tablet (20 mg total) by mouth 2 (two) times daily. 30 tablet Zenia Resides, MD  ? predniSONE (DELTASONE) 20 MG tablet Take 2 tablets (40 mg total) by mouth daily with breakfast for 5 days. 10 tablet Zenia Resides, MD  ? ?  ? ?PDMP not reviewed this encounter. ?  ?Zenia Resides, MD ?05/21/21 1933 ? ?

## 2021-05-21 NOTE — ED Triage Notes (Signed)
C/o tightness in throat that started yesterday. She took 2 montelukast around 4:00 am. She has a history of allergies and asthma.  ?

## 2021-05-21 NOTE — Discharge Instructions (Addendum)
You were given a shot of triamcinolone tonight. Also a dose of famotidine 20 mg and benadryl 25 mg. ? ?Take famotidine 20 mg 1 tab twice daily for a few days ? ?Take prednisone 20 mg --2 daily for 5 days. ? ?Take zyrtec/cetirizine 10 mg 1 daily for allergy ? ?Use your inhaler albuterol 2 puffs every 4 hours as needed for wheezing/shortness of breath ?

## 2022-07-20 ENCOUNTER — Ambulatory Visit (INDEPENDENT_AMBULATORY_CARE_PROVIDER_SITE_OTHER): Payer: BC Managed Care – PPO | Admitting: Podiatry

## 2022-07-20 DIAGNOSIS — M62461 Contracture of muscle, right lower leg: Secondary | ICD-10-CM

## 2022-07-20 DIAGNOSIS — M722 Plantar fascial fibromatosis: Secondary | ICD-10-CM

## 2022-07-20 MED ORDER — MELOXICAM 15 MG PO TABS: 15.0000 mg | ORAL_TABLET | Freq: Every day | ORAL | 3 refills | Status: DC

## 2022-07-20 NOTE — Patient Instructions (Signed)

## 2022-07-20 NOTE — Progress Notes (Signed)
  Subjective:  Patient ID: Jordan Solis, female    DOB: 1995/02/18,  MRN: [id number]  Chief Complaint  Patient presents with   Foot Pain    Patient had surgery and she is having alot of pain walking across campus both feet. Pt requesting Dr Lilian Kapur.    27 y.o. female presents with the above complaint. History confirmed with patient.  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 Allena Katz.  That side is occasionally tender still but overall doing okay.  She is having significant pain in the right foot heel now  Objective:  Physical Exam: warm, good capillary refill, no trophic changes or ulcerative lesions, normal DP and PT pulses, and normal sensory exam.  Right Foot: point tenderness over the heel pad and gastrocnemius equinus is noted with a positive silverskiold test    Assessment:   1. Gastrocnemius equinus of right lower extremity   2. Plantar fasciitis of right foot      Plan:  Patient was evaluated and treated and all questions answered.   Discussed the etiology and treatment options for plantar fasciitis including stretching, formal physical therapy, supportive shoegears such as a running shoe or sneaker, pre fabricated orthoses, injection therapy, and oral medications. We also discussed the role of surgical treatment of this for patients who do not improve after exhausting non-surgical treatment options.   -XR reviewed with patient -Educated patient on stretching and icing of the affected limb -Injection delivered to the plantar fascia of the right foot. -Rx for meloxicam. Educated on use, risks and benefits of the medication  After sterile prep with povidone-iodine solution and alcohol, the right heel was injected with 0.5cc 2% xylocaine plain, 0.5cc 0.5% marcaine plain, 5mg  triamcinolone acetonide, and 2mg  dexamethasone was injected along the medial plantar fascia at the insertion on the plantar calcaneus. The patient tolerated the procedure well without  complication.  Return in about 8 weeks (around 09/14/2022) for recheck plantar fasciitis.

## 2022-09-09 ENCOUNTER — Ambulatory Visit: Payer: BC Managed Care – PPO | Admitting: Podiatry

## 2022-09-29 ENCOUNTER — Other Ambulatory Visit: Payer: Self-pay

## 2022-09-29 ENCOUNTER — Ambulatory Visit
Admission: EM | Admit: 2022-09-29 | Discharge: 2022-09-29 | Disposition: A | Discharge status: Home / Self Care | Payer: Federal, State, Local not specified - PPO | Attending: Emergency Medicine | Admitting: Emergency Medicine

## 2022-09-29 ENCOUNTER — Ambulatory Visit (INDEPENDENT_AMBULATORY_CARE_PROVIDER_SITE_OTHER): Payer: Federal, State, Local not specified - PPO

## 2022-09-29 DIAGNOSIS — R0789 Other chest pain: Secondary | ICD-10-CM | POA: Diagnosis present

## 2022-09-29 DIAGNOSIS — Z1152 Encounter for screening for COVID-19: Secondary | ICD-10-CM | POA: Insufficient documentation

## 2022-09-29 DIAGNOSIS — R079 Chest pain, unspecified: Secondary | ICD-10-CM

## 2022-09-29 LAB — SARS CORONAVIRUS 2 BY RT PCR: SARS Coronavirus 2 by RT PCR: NEGATIVE

## 2022-09-29 MED ORDER — KETOROLAC TROMETHAMINE 30 MG/ML IJ SOLN
30.0000 mg | Freq: Once | INTRAMUSCULAR | Status: AC
  Administered 2022-09-29: 30 mg via INTRAMUSCULAR

## 2022-09-29 MED ORDER — PREDNISONE 10 MG (21) PO TBPK: ORAL_TABLET | ORAL | 0 refills | Status: DC

## 2022-09-29 NOTE — ED Provider Notes (Signed)
[facility]    CSN: [id number] Arrival date & time: 09/29/22  1132      History   Chief Complaint Chief Complaint  Patient presents with   Chest Pain   Respiratory Distress    HPI Jordan Solis is a 27 y.o. female.   HPI  7 old female with a past medical history significant for abnormal uterine bleeding, migraines, scoliosis, and asthma presents for evaluation of mid chest pain that started suddenly yesterday.  It radiates down the front of her chest and into the top of her abdomen.  It does increase with laying flat, movement, and deep breathing.  She reports as 8/10 at rest and 10/10 at maximum.  She does report that she has had some shortness of breath but she denies any wheezing or cough.  She denies any increase in physical activity though she did you start back to campus.  Past Medical History:  Diagnosis Date   Asthma     Patient Active Problem List   Diagnosis Date Noted   Allergic rhinitis 01/04/2018   Migraine 01/04/2018   Abnormal uterine bleeding (AUB) 05/01/2016   Scoliosis of thoracolumbar spine 05/01/2016   Vitamin D deficiency 05/01/2016   Unspecified asthma, uncomplicated 08/16/2012    Past Surgical History:  Procedure Laterality Date   NO PAST SURGERIES     plantar faciitis Left     OB History   No obstetric history on file.      Home Medications    Prior to Admission medications   Medication Sig Start Date End Date Taking? Authorizing Provider  acetaminophen (TYLENOL) 500 MG tablet Take by mouth. 11/09/14  Yes [provider]  albuterol (VENTOLIN HFA) 108 (90 Base) MCG/ACT inhaler Inhale 2 puffs into the lungs every 4 (four) hours as needed for wheezing or shortness of breath. 05/21/21  Yes Zenia Resides, MD  BREO ELLIPTA 200-25 MCG/INH AEPB  10/21/18  Yes [provider]  cetirizine HCl (ZYRTEC) 1 MG/ML solution Take by mouth daily.   Yes [provider]  chlorhexidine (PERIDEX) 0.12 % solution   09/26/18  Yes [provider]  chlorhexidine (PERIDEX) 0.12 % solution  09/26/18  Yes [provider]  Cholecalciferol (VITAMIN D3) 75 MCG (3000 UT) TABS Take by mouth.   Yes [provider]  cyclobenzaprine (FLEXERIL) 10 MG tablet Take 1 tablet (10 mg total) by mouth 3 (three) times daily as needed for muscle spasms. 12/19/20  Yes Candelaria Stagers, DPM  fluticasone (FLONASE) 50 MCG/ACT nasal spray Place into the nose. 08/23/15  Yes [provider]  montelukast (SINGULAIR) 10 MG tablet Take by mouth. 07/10/15  Yes [provider]  Multiple Vitamin (QUINTABS) TABS Take by mouth.   Yes [provider]  predniSONE (STERAPRED UNI-PAK 21 TAB) 10 MG (21) TBPK tablet Take 6 tablets on day 1, 5 tablets day 2, 4 tablets day 3, 3 tablets day 4, 2 tablets day 5, 1 tablet day 6 09/29/22  Yes Becky Augusta, NP  Spacer/Aero-Holding Chambers (AEROCHAMBER PLUS) inhaler Use as instructed 05/04/17  Yes Domenick Gong, MD    Family History Family History  Problem Relation Age of Onset   Healthy Mother    Healthy Father     Social History Social History   Tobacco Use   Smoking status: Never   Smokeless tobacco: Never  Vaping Use   Vaping status: Never Used  Substance Use Topics   Alcohol use: No   Drug use: No  Allergies   Penicillins, Shellfish allergy, and Pork allergy   Review of Systems Review of Systems  Respiratory:  Positive for shortness of breath. Negative for cough and wheezing.   Cardiovascular:  Positive for chest pain. Negative for palpitations.     Physical Exam Triage Vital Signs ED Triage Vitals  Encounter Vitals Group     BP 09/29/22 1216 113/71     Systolic BP Percentile --      Diastolic BP Percentile --      Pulse Rate 09/29/22 1216 74     Resp 09/29/22 1216 18     Temp 09/29/22 1216 98.6 F (37 C)     Temp src --      SpO2 09/29/22 1216 100 %     Weight --      Height --      Head Circumference --       Peak Flow --      Pain Score 09/29/22 1211 8     Pain Loc --      Pain Education --      Exclude from Growth Chart --    No data found.  Updated Vital Signs BP 113/71   Pulse 74   Temp 98.6 F (37 C)   Resp 18   LMP 09/04/2022   SpO2 100%   Visual Acuity Right Eye Distance:   Left Eye Distance:   Bilateral Distance:    Right Eye Near:   Left Eye Near:    Bilateral Near:     Physical Exam Vitals and nursing note reviewed.  Constitutional:      Appearance: Normal appearance. She is not ill-appearing.  HENT:     Head: Normocephalic and atraumatic.  Cardiovascular:     Rate and Rhythm: Normal rate and regular rhythm.     Pulses: Normal pulses.     Heart sounds: Normal heart sounds. No murmur heard.    No friction rub. No gallop.  Pulmonary:     Effort: Pulmonary effort is normal.     Breath sounds: Normal breath sounds. No wheezing, rhonchi or rales.     Comments: Patient has pain with palpation of her costosternal joints on both sides of her sternum as well as anterior chest wall bilaterally. Chest:     Chest wall: Tenderness present.  Skin:    General: Skin is warm and dry.     Capillary Refill: Capillary refill takes less than 2 seconds.     Findings: No bruising.  Neurological:     General: No focal deficit present.     Mental Status: She is alert and oriented to person, place, and time.      UC Treatments / Results  Labs (all labs ordered are listed, but only abnormal results are displayed) Labs Reviewed  SARS CORONAVIRUS 2 BY RT PCR    EKG Normal sinus rhythm with a ventricular rate of 70 bpm PR interval 124 ms QRS duration 86 ms QT/QTc 406/430 ms No ST or T wave abnormalities noted.  Radiology No results found.  Procedures Procedures (including critical care time)  Medications Ordered in UC Medications  ketorolac (TORADOL) 30 MG/ML injection 30 mg (30 mg Intramuscular Given 09/29/22 1315)    Initial Impression / Assessment and Plan / UC  Course  I have reviewed the triage vital signs and the nursing notes.  Pertinent labs & imaging results that were available during my care of the patient were reviewed by me and considered in my medical decision  making (see chart for details).   Patient is a nontoxic-appearing 27 year old female presenting for evaluation of chest pain in the center of her chest that radiates down into the top of her abdomen.  She does endorse shortness of breath though she is able to speak in full sentence without dyspnea or tachypnea.  Triage respiratory was 18 with a 100% room air oxygen saturation.  Patient's cardiopulmonary exam reveals S1-S2 heart sounds with regular rate and rhythm and lung sounds that are clear to auscultation all fields.  She does have tenderness with palpation of the length of her sternum as well as palpation of both the left and right anterior chest wall.  No crepitus appreciated.  No ecchymosis or erythema noted.  She states that she has recently started back to campus for school but she does not walk a long way.  She denies any new exercise programs or increase in physical exertion.  Given that her pain increases with movement and deep breathing, and is reproducible to palpation, I suspect that this is musculoskeletal chest wall pain.  I will check an EKG to evaluate for any cardiac contributions as well as obtain a chest x-ray to look for any acute cardiopulmonary pathology.  I have ordered 30 mg of IM Toradol to help patient with her pain as she has not taken anything at present.  EKG shows normal sinus rhythm without any T wave or ST abnormalities.  No other tracings available for comparison in epic.  X-ray independently reviewed and evaluated by me.  Pression: Lung fields are well aerated and there is no evidence of effusion or infiltrate.  Radiology overread is pending.  Patient reports that she has not had any improvement since receiving her Toradol 30 minutes ago.  Patient requested a  COVID PCR and it is negative.  I will discharge patient home with musculoskeletal chest wall pain diagnosis and treat her with a steroid taper.   Final Clinical Impressions(s) / UC Diagnoses   Final diagnoses:  Chest wall pain     Discharge Instructions      Your chest x-ray did not show any evidence of pneumonia or other abnormalities, your EKG was unremarkable, and your COVID test was negative.  Your exam is most consistent with musculoskeletal chest wall pain.  Starting tomorrow morning take the prednisone according to the package instructions.  You will take it on a tapering dose, that means decreasing dosage each day, for 6 days.  Make sure you take it in the morning with breakfast.  Always take it with food.  You can apply ice or moist heat to your chest for 20 to the time 2-3 times a day to help with pain and inflammation.  If your symptoms do not improve I recommend that you follow-up with your primary care provider.     ED Prescriptions     Medication Sig Dispense Auth. Provider   predniSONE (STERAPRED UNI-PAK 21 TAB) 10 MG (21) TBPK tablet Take 6 tablets on day 1, 5 tablets day 2, 4 tablets day 3, 3 tablets day 4, 2 tablets day 5, 1 tablet day 6 21 tablet Becky Augusta, NP      PDMP not reviewed this encounter.   Becky Augusta, NP 09/29/22 1359

## 2022-09-29 NOTE — Discharge Instructions (Addendum)
Your chest x-ray did not show any evidence of pneumonia or other abnormalities, your EKG was unremarkable, and your COVID test was negative.  Your exam is most consistent with musculoskeletal chest wall pain.  Starting tomorrow morning take the prednisone according to the package instructions.  You will take it on a tapering dose, that means decreasing dosage each day, for 6 days.  Make sure you take it in the morning with breakfast.  Always take it with food.  You can apply ice or moist heat to your chest for 20 to the time 2-3 times a day to help with pain and inflammation.  If your symptoms do not improve I recommend that you follow-up with your primary care provider.

## 2022-09-29 NOTE — ED Triage Notes (Addendum)
Sudden onset of chest pain that started yesterday. Pt denies injury. Pt states pain starts in her chest and radiates into abd. Pt states lying flat and taking a deep breath makes it worst.

## 2022-10-27 ENCOUNTER — Ambulatory Visit
Admission: EM | Admit: 2022-10-27 | Discharge: 2022-10-27 | Disposition: A | Discharge status: Home / Self Care | Payer: Federal, State, Local not specified - PPO | Attending: Emergency Medicine | Admitting: Emergency Medicine

## 2022-10-27 DIAGNOSIS — T781XXA Other adverse food reactions, not elsewhere classified, initial encounter: Secondary | ICD-10-CM | POA: Diagnosis not present

## 2022-10-27 MED ORDER — FAMOTIDINE 40 MG PO TABS
40.0000 mg | ORAL_TABLET | Freq: Once | ORAL | Status: AC
  Administered 2022-10-27: 40 mg via ORAL

## 2022-10-27 MED ORDER — PREDNISONE 10 MG (21) PO TBPK: ORAL_TABLET | ORAL | 0 refills | Status: AC

## 2022-10-27 MED ORDER — DIPHENHYDRAMINE HCL 50 MG PO CAPS
50.0000 mg | ORAL_CAPSULE | Freq: Once | ORAL | Status: AC
  Administered 2022-10-27: 50 mg via ORAL

## 2022-10-27 MED ORDER — PREDNISONE 50 MG PO TABS
60.0000 mg | ORAL_TABLET | Freq: Once | ORAL | Status: AC
  Administered 2022-10-27: 60 mg via ORAL

## 2022-10-27 MED ORDER — FAMOTIDINE 20 MG PO TABS: 20.0000 mg | ORAL_TABLET | Freq: Two times a day (BID) | ORAL | 0 refills | Status: AC

## 2022-10-27 NOTE — Discharge Instructions (Signed)
Continue Pepcid, Claritin or Zyrtec and the steroids for the next 5 or 6 days.  You can start the medications tomorrow.  Use your EpiPen and go immediately to the emergency department if you get worse.

## 2022-10-27 NOTE — ED Provider Notes (Signed)
HPI  SUBJECTIVE:  Jordan Solis is a 27 y.o. female who presents with allergic reaction after eating chicken Alfredo cross contaminated with shrimp last night.  She states that she felt as if her throat was closing, increased salivation, nausea, coughing, wheezing, shortness of breath, abdominal pain and diarrhea.  She took 12 and half milligrams Benadryl, 2 tabs of Singulair 10 mg, Allegra, used her albuterol inhaler twice with improvement in her symptoms.  She states that most of her symptoms have resolved, but her throat still feels tingling and itchy, and has a mild residual cough and raspy throat.  Her throat no longer feels like it is swelling shut.  She denies tongue or lip swelling, drooling, vomiting, rash, flushing, hives, syncope.  She has a past medical history of seasonal allergies, anaphylaxis to shellfish and has needed an EpiPen in the past.  She has not used her EpiPen this time.  She is also allergic to penicillin, pork, and has a history of asthma.    Past Medical History:  Diagnosis Date   Asthma     Past Surgical History:  Procedure Laterality Date   NO PAST SURGERIES     plantar faciitis Left     Family History  Problem Relation Age of Onset   Healthy Mother    Healthy Father     Social History   Tobacco Use   Smoking status: Never   Smokeless tobacco: Never  Vaping Use   Vaping status: Never Used  Substance Use Topics   Alcohol use: No   Drug use: No    No current facility-administered medications for this encounter.  Current Outpatient Medications:    famotidine (PEPCID) 20 MG tablet, Take 1 tablet (20 mg total) by mouth 2 (two) times daily., Disp: 10 tablet, Rfl: 0   predniSONE (STERAPRED UNI-PAK 21 TAB) 10 MG (21) TBPK tablet, Dispense one 6 day pack. Take as directed with food., Disp: 21 tablet, Rfl: 0   acetaminophen (TYLENOL) 500 MG tablet, Take by mouth., Disp: , Rfl:    albuterol (VENTOLIN HFA) 108 (90 Base) MCG/ACT inhaler, Inhale 2 puffs  into the lungs every 4 (four) hours as needed for wheezing or shortness of breath., Disp: 1 each, Rfl: 0   BREO ELLIPTA 200-25 MCG/INH AEPB, , Disp: , Rfl:    cetirizine HCl (ZYRTEC) 1 MG/ML solution, Take by mouth daily., Disp: , Rfl:    chlorhexidine (PERIDEX) 0.12 % solution, , Disp: , Rfl:    chlorhexidine (PERIDEX) 0.12 % solution, , Disp: , Rfl:    Cholecalciferol (VITAMIN D3) 75 MCG (3000 UT) TABS, Take by mouth., Disp: , Rfl:    cyclobenzaprine (FLEXERIL) 10 MG tablet, Take 1 tablet (10 mg total) by mouth 3 (three) times daily as needed for muscle spasms., Disp: 30 tablet, Rfl: 0   fluticasone (FLONASE) 50 MCG/ACT nasal spray, Place into the nose., Disp: , Rfl:    montelukast (SINGULAIR) 10 MG tablet, Take by mouth., Disp: , Rfl:    Multiple Vitamin (QUINTABS) TABS, Take by mouth., Disp: , Rfl:    Spacer/Aero-Holding Chambers (AEROCHAMBER PLUS) inhaler, Use as instructed, Disp: 1 each, Rfl: 2  Allergies  Allergen Reactions   Penicillins Anaphylaxis   Shellfish Allergy Anaphylaxis   Pork Allergy Rash     ROS  As noted in HPI.   Physical Exam  BP 108/61 (BP Location: Left Arm)   Pulse 99   Temp 98.8 F (37.1 C) (Oral)   Resp 16   LMP 10/27/2022  SpO2 98%   Constitutional: Well developed, well nourished, no acute distress Eyes:  EOMI, conjunctiva normal bilaterally HENT: Normocephalic, atraumatic,mucus membranes moist.  No angioedema of the lips or tongue.  Airway widely patent. Respiratory: Normal inspiratory effort, lungs clear bilaterally.  No wheezing. Cardiovascular: Normal rate, regular rhythm, no murmurs rubs or gallops GI: nondistended skin: No flushing, urticaria over extremities, trunk Musculoskeletal: no deformities Neurologic: Alert & oriented x 3, no focal neuro deficits Psychiatric: Speech and behavior appropriate   ED Course   Medications  predniSONE (DELTASONE) tablet 60 mg (60 mg Oral Given 10/27/22 1423)  diphenhydrAMINE (BENADRYL) capsule 50  mg (50 mg Oral Given 10/27/22 1424)  famotidine (PEPCID) tablet 40 mg (40 mg Oral Given 10/27/22 1425)    No orders of the defined types were placed in this encounter.   No results found for this or any previous visit (from the past 24 hour(s)). No results found.  ED Clinical Impression  1. Allergic reaction to food, initial encounter      ED Assessment/Plan     No evidence of anaphylaxis at this time, although history is concerning for early anaphylaxis last night.  Favor allergic reaction.  Will give 50 mg of Benadryl, 60 mg of prednisone, 40 mg of Pepcid here and reevaluate.  Patient states that she can get her mother to come pick her up.  On reevaluation, patient states that she feels significantly better.  Will send home with Pepcid, prednisone, Claritin or Zyrtec.  She does not need another prescription for an EpiPen.  Follow-up with PCP as needed.  ER return precautions given.  Discussed MDM, treatment plan, and plan for follow-up with patient. Discussed sn/sx that should prompt return to the ED. patient agrees with plan.   Meds ordered this encounter  Medications   predniSONE (DELTASONE) tablet 60 mg   diphenhydrAMINE (BENADRYL) capsule 50 mg   famotidine (PEPCID) tablet 40 mg   famotidine (PEPCID) 20 MG tablet    Sig: Take 1 tablet (20 mg total) by mouth 2 (two) times daily.    Dispense:  10 tablet    Refill:  0   predniSONE (STERAPRED UNI-PAK 21 TAB) 10 MG (21) TBPK tablet    Sig: Dispense one 6 day pack. Take as directed with food.    Dispense:  21 tablet    Refill:  0      *This clinic note was created using Scientist, clinical (histocompatibility and immunogenetics). Therefore, there may be occasional mistakes despite careful proofreading.  ?    Domenick Gong, MD 10/30/22 [phone]

## 2022-10-27 NOTE — ED Triage Notes (Signed)
Patient presents to UC for allergic reaction. States she went out to eat out and unsure if her food was contaminated with shellfish. She has a shellfish allergy. States about 20 mins post meal she developed a tingling sensation in her throat. She took half a dose of benadryl, albuterol inhaler, and Singulair. Did not have to use her Epipen. Reports waking up this morning she continued having the tingling sensation.   Denies SOB, chest pain.

## 2022-10-27 NOTE — ED Notes (Signed)
Checked on patient, denies any oral swelling, SOB, states she is comfortable but sleepy.

## 2022-11-27 ENCOUNTER — Ambulatory Visit: Payer: Medicaid Other

## 2022-11-27 NOTE — Progress Notes (Signed)
Patient was present and evaluated for Custom molded foot orthotics. Patient will benefit from CFO's to provide total contact to BIL MLA's helping to balance and distribute body weight more evenly across BIL feet helping to reduce plantar pressure and pain. Orthotic will also encourage FF / RF alignment  Patient was scanned today and will return for fitting upon receipt  Addison Bailey Cped, CFo, CFm

## 2022-12-30 ENCOUNTER — Ambulatory Visit (INDEPENDENT_AMBULATORY_CARE_PROVIDER_SITE_OTHER): Payer: Medicaid Other | Admitting: Podiatry

## 2022-12-30 ENCOUNTER — Encounter: Payer: Self-pay | Admitting: Podiatry

## 2022-12-30 ENCOUNTER — Ambulatory Visit (INDEPENDENT_AMBULATORY_CARE_PROVIDER_SITE_OTHER): Payer: Medicaid Other

## 2022-12-30 DIAGNOSIS — M722 Plantar fascial fibromatosis: Secondary | ICD-10-CM | POA: Diagnosis not present

## 2022-12-30 DIAGNOSIS — M7661 Achilles tendinitis, right leg: Secondary | ICD-10-CM

## 2022-12-30 DIAGNOSIS — M62462 Contracture of muscle, left lower leg: Secondary | ICD-10-CM | POA: Diagnosis not present

## 2022-12-30 DIAGNOSIS — M62461 Contracture of muscle, right lower leg: Secondary | ICD-10-CM

## 2023-01-03 ENCOUNTER — Encounter: Payer: Self-pay | Admitting: Podiatry

## 2023-01-03 MED ORDER — DICLOFENAC SODIUM 75 MG PO TBEC: 75.0000 mg | DELAYED_RELEASE_TABLET | Freq: Two times a day (BID) | ORAL | 0 refills | Status: AC

## 2023-01-03 NOTE — Progress Notes (Signed)
  Subjective:  Patient ID: Jordan Solis, female    DOB: 1995-10-05,  MRN: [id number]  Chief Complaint  Patient presents with   Foot Pain    "My heels hurt, the pain travels up my ankles."    27 y.o. female presents with the above complaint. History confirmed with patient.  Right side is still much worse.  The injection did not help much.  She feels it more in the back of the heel and Achilles tendon now  Objective:  Physical Exam: warm, good capillary refill, no trophic changes or ulcerative lesions, normal DP and PT pulses, and normal sensory exam.  Right Foot: point tenderness over the heel pad, tenderness at Achilles tendon insertion, and gastrocnemius equinus is noted with a positive silverskiold test Left foot slight tenderness on plantar heel posterior Achilles   Assessment:   1. Plantar fasciitis, bilateral   2. Tendonitis, Achilles, right   3. Gastrocnemius equinus of left lower extremity   4. Gastrocnemius equinus of right lower extremity      Plan:  Patient was evaluated and treated and all questions answered.  Has not had major improvement.  I recommended use of prescription strength anti-inflammatory and Rx for Voltaren p.o. sent to pharmacy.  Also recommend physical therapy and patient to physical therapy [facility] as she is going to be relocating there.  I will see her back in 2 months to follow-up on this.  If no improvement by that point would recommend MRI.  Return in about 2 months (around 03/01/2023) for re-check Achilles tendon, recheck plantar fasciitis.

## 2023-01-22 ENCOUNTER — Other Ambulatory Visit: Payer: Medicaid Other

## 2023-01-25 ENCOUNTER — Ambulatory Visit: Payer: Medicaid Other | Attending: Podiatry

## 2023-01-25 NOTE — Therapy (Incomplete)
OUTPATIENT PHYSICAL THERAPY LOWER EXTREMITY EVALUATION   Patient Name: Jordan Solis MRN: [id number] DOB:02-12-1995, 27 y.o., female Today's Date: 01/25/2023  END OF SESSION:   Past Medical History:  Diagnosis Date   Asthma    Past Surgical History:  Procedure Laterality Date   NO PAST SURGERIES     plantar faciitis Left    Patient Active Problem List   Diagnosis Date Noted   Allergic rhinitis 01/04/2018   Migraine 01/04/2018   Abnormal uterine bleeding (AUB) 05/01/2016   Scoliosis of thoracolumbar spine 05/01/2016   Vitamin D deficiency 05/01/2016   Unspecified asthma, uncomplicated 08/16/2012    PCP: [facility]  REFERRING PROVIDER: Edwin Cap, DPM  REFERRING DIAG:  M72.2 (ICD-10-CM) - Plantar fasciitis, bilateral M76.61 (ICD-10-CM) - Tendonitis, Achilles, right M62.462 (ICD-10-CM) - Gastrocnemius equinus of left lower extremity M62.461 (ICD-10-CM) - Gastrocnemius equinus of right lower extremity  THERAPY DIAG:  No diagnosis found.  Rationale for Evaluation and Treatment: Rehabilitation  ONSET DATE: ***  SUBJECTIVE:   SUBJECTIVE STATEMENT: ***  PERTINENT HISTORY: ***  PAIN:  Are you having pain?  Yes: NPRS scale: ***/10 Pain location: *** Pain description: *** Aggravating factors: *** Relieving factors: ***  PRECAUTIONS: {Therapy precautions:24002}  RED FLAGS: {PT Red Flags:29287}   WEIGHT BEARING RESTRICTIONS: {Yes ***/No:24003}  FALLS:  Has patient fallen in last 6 months? {fallsyesno:27318}  LIVING ENVIRONMENT: Lives with: {[facility] lives with:25569::"lives with their family"} Lives in: {Lives in:25570} Stairs: {opstairs:27293} Has following equipment at home: {Assistive devices:23999}  OCCUPATION: ***  PLOF: {PLOF:24004}  PATIENT GOALS: ***  NEXT MD VISIT: ***  OBJECTIVE:  Note: Objective measures were completed at Evaluation unless otherwise noted.  DIAGNOSTIC FINDINGS: ***  PATIENT SURVEYS:  FOTO:  ***% function; ***% predicted  COGNITION: Overall cognitive status: {cognition:24006}     SENSATION: {sensation:27233}  EDEMA:  {edema:24020}  MUSCLE LENGTH: Hamstrings: Right *** deg; Left *** deg Thomas test: Right *** deg; Left *** deg  POSTURE: {posture:25561}  PALPATION: ***  LOWER EXTREMITY ROM:  {AROM/PROM:27142} ROM Right eval Left eval  Hip flexion    Hip extension    Hip abduction    Hip adduction    Hip internal rotation    Hip external rotation    Knee flexion    Knee extension    Ankle dorsiflexion    Ankle plantarflexion    Ankle inversion    Ankle eversion     (Blank rows = not tested)  LOWER EXTREMITY MMT:  MMT Right eval Left eval  Hip flexion    Hip extension    Hip abduction    Hip adduction    Hip internal rotation    Hip external rotation    Knee flexion    Knee extension    Ankle dorsiflexion    Ankle plantarflexion    Ankle inversion    Ankle eversion     (Blank rows = not tested)  LOWER EXTREMITY SPECIAL TESTS:  {LEspecialtests:26242}  FUNCTIONAL TESTS:  {Functional tests:24029}  GAIT: Distance walked: *** Assistive device utilized: {Assistive devices:23999} Level of assistance: {Levels of assistance:24026} Comments: ***  TREATMENT: [facility] Adult PT Treatment:                                                DATE: 01/25/2023 Therapeutic Exercise: *** Manual Therapy: *** Neuromuscular re-ed: *** Therapeutic Activity: *** Modalities: ***  Self Care: ***  PATIENT EDUCATION:  Education details: eval findings, FOTO, HEP, POC Person educated: Patient Education method: Explanation, Demonstration, and Handouts Education comprehension: verbalized understanding and returned demonstration  HOME EXERCISE PROGRAM: ***  ASSESSMENT:  CLINICAL IMPRESSION: Patient is a *** y.o. *** who was seen today for physical therapy evaluation and treatment for ***.   OBJECTIVE IMPAIRMENTS: {opptimpairments:25111}.   ACTIVITY  LIMITATIONS: {activitylimitations:27494}  PARTICIPATION LIMITATIONS: {participationrestrictions:25113}  PERSONAL FACTORS: {Personal factors:25162} are also affecting patient's functional outcome.   REHAB POTENTIAL: {rehabpotential:25112}  CLINICAL DECISION MAKING: {clinical decision making:25114}  EVALUATION COMPLEXITY: {Evaluation complexity:25115}   GOALS: Goals reviewed with patient? No  SHORT TERM GOALS: Target date: 02/15/2023   Pt will be compliant and knowledgeable with initial HEP for improved comfort and carryover Baseline: initial HEP given  Goal status: INITIAL  2.  Pt will self report *** pain no greater than ***/10 for improved comfort and functional ability Baseline: ***/10 at worst Goal status: {GOALSTATUS:25110}   LONG TERM GOALS: Target date: ***  Pt will improve FOTO function score to no less than ***% as proxy for functional improvement Baseline: ***% function Goal status: {GOALSTATUS:25110}   2.  Pt will self report *** pain no greater than ***/10 for improved comfort and functional ability Baseline: ***/10 at worst Goal status: {GOALSTATUS:25110}   3.  *** Baseline:  Goal status: INITIAL  4.  *** Baseline:  Goal status: INITIAL  5.  *** Baseline:  Goal status: INITIAL   PLAN:  PT FREQUENCY: {rehab frequency:25116}  PT DURATION: {rehab duration:25117}  PLANNED INTERVENTIONS: {rehab planned interventions:25118::"97110-Therapeutic exercises","97530- Therapeutic [phone]- Neuromuscular re-education","97535- Self QIHK","74259- Manual therapy"}  PLAN FOR NEXT SESSION: ***   Eloy End, PT 01/25/2023, 7:59 AM

## 2023-03-03 ENCOUNTER — Encounter: Payer: Self-pay | Admitting: Podiatry

## 2023-03-03 ENCOUNTER — Ambulatory Visit (INDEPENDENT_AMBULATORY_CARE_PROVIDER_SITE_OTHER): Payer: Medicaid Other | Admitting: Podiatry

## 2023-03-03 DIAGNOSIS — M62461 Contracture of muscle, right lower leg: Secondary | ICD-10-CM

## 2023-03-03 DIAGNOSIS — M722 Plantar fascial fibromatosis: Secondary | ICD-10-CM | POA: Diagnosis not present

## 2023-03-03 DIAGNOSIS — M7661 Achilles tendinitis, right leg: Secondary | ICD-10-CM

## 2023-03-03 DIAGNOSIS — M62462 Contracture of muscle, left lower leg: Secondary | ICD-10-CM

## 2023-03-03 MED ORDER — MELOXICAM 15 MG PO TABS: 15.0000 mg | ORAL_TABLET | Freq: Every day | ORAL | 3 refills | Status: AC

## 2023-03-04 NOTE — Progress Notes (Signed)
  Subjective:  Patient ID: Jordan Solis, female    DOB: 07-26-1995,  MRN: [id number]  Chief Complaint  Patient presents with   Plantar Fasciitis    "They're doing good.  Keeping them warm helps reduce the pain.  I am having a problem getting my orthotics."    28 y.o. female presents with the above complaint. History confirmed with patient.  Overall doing better  Objective:  Physical Exam: warm, good capillary refill, no trophic changes or ulcerative lesions, normal DP and PT pulses, and normal sensory exam.  Today very little pain to palpation Assessment:   1. Plantar fasciitis, bilateral   2. Tendonitis, Achilles, right   3. Gastrocnemius equinus of left lower extremity   4. Gastrocnemius equinus of right lower extremity      Plan:  Patient was evaluated and treated and all questions answered.  Has improved with some home therapy she still would like to have formal physical therapy and do this at [facility] in [place] where her mother attends for aqua therapy.  Referral will be sent.  Her orthotics were ready for dispensation today and they were assessed for form fit and function.  She will let me know if there is any issue with them.  She will follow-up with me in 2 months.  Return in about 2 months (around 05/01/2023) for re-check Achilles tendon, recheck plantar fasciitis.

## 2023-03-05 ENCOUNTER — Telehealth: Payer: Self-pay

## 2023-03-05 NOTE — Telephone Encounter (Signed)
Referral, demographics and office note faxed to  [facility] PT/OT [facility] [address] [place] [address] Phone [phone] Fax [phone]  Confirmation received

## 2023-03-05 NOTE — Telephone Encounter (Signed)
-----   Message from Edwin Cap sent at 03/04/2023 12:56 PM EST ----- Can you fax this physical therapy referral to [facility] therapy and [facility]?  Mainly for aqua therapy

## 2023-04-28 ENCOUNTER — Ambulatory Visit: Payer: Medicaid Other | Admitting: Podiatry

## 2023-05-10 ENCOUNTER — Ambulatory Visit (INDEPENDENT_AMBULATORY_CARE_PROVIDER_SITE_OTHER): Payer: Medicaid Other | Admitting: Podiatry

## 2023-05-10 DIAGNOSIS — Z91199 Patient's noncompliance with other medical treatment and regimen due to unspecified reason: Secondary | ICD-10-CM

## 2023-05-12 NOTE — Progress Notes (Signed)
 Patient was no-show for appointment today

## 2023-09-27 ENCOUNTER — Encounter: Payer: Self-pay | Admitting: Emergency Medicine

## 2023-09-27 ENCOUNTER — Ambulatory Visit: Admission: EM | Admit: 2023-09-27 | Discharge: 2023-09-27 | Disposition: A | Discharge status: Home / Self Care

## 2023-09-27 DIAGNOSIS — H01004 Unspecified blepharitis left upper eyelid: Secondary | ICD-10-CM | POA: Diagnosis not present

## 2023-09-27 MED ORDER — ERYTHROMYCIN 5 MG/GM OP OINT: TOPICAL_OINTMENT | OPHTHALMIC | 1 refills | Status: AC

## 2023-09-27 NOTE — ED Triage Notes (Signed)
 Pt presents with left eye irritation, pain and drainage x 1 week.

## 2023-09-27 NOTE — ED Provider Notes (Signed)
 [facility]    CSN: [id number] Arrival date & time: 09/27/23  1844      History   Chief Complaint Chief Complaint  Patient presents with   Eye Problem    HPI Jordan Solis is a 28 y.o. female.   HPI  28 year old female with past medical history significant for migraine headaches, allergic rhinitis, and asthma presents for evaluation of left eye pain with green drainage and matting of her eyelids that has been going on for the last week.  She denies any trauma to her eye or be around any small children or people similar symptoms.  She does wear daily contact lenses.  No fever.  Past Medical History:  Diagnosis Date   Asthma     Patient Active Problem List   Diagnosis Date Noted   Allergic rhinitis 01/04/2018   Migraine 01/04/2018   Abnormal uterine bleeding (AUB) 05/01/2016   Scoliosis of thoracolumbar spine 05/01/2016   Vitamin D deficiency 05/01/2016   Unspecified asthma, uncomplicated 08/16/2012    Past Surgical History:  Procedure Laterality Date   NO PAST SURGERIES     plantar faciitis Left     OB History   No obstetric history on file.      Home Medications    Prior to Admission medications   Medication Sig Start Date End Date Taking? Authorizing Provider  EPINEPHrine (EPIPEN 2-PAK) 0.3 mg/0.3 mL IJ SOAJ injection as directed Injection 11/29/20  Yes [provider]  erythromycin  ophthalmic ointment Place a 1/2 inch ribbon of ointment into the lower eyelid. 09/27/23  Yes Bernardino Ditch, NP  WEGOVY 1 MG/0.5ML SOAJ SQ injection SMARTSIG:0.5 Milliliter(s) SUB-Q Once a Week 09/09/23  Yes [provider]  acetaminophen  (TYLENOL ) 500 MG tablet Take by mouth. 11/09/14   [provider]  albuterol  (VENTOLIN  HFA) 108 (90 Base) MCG/ACT inhaler Inhale 2 puffs into the lungs every 4 (four) hours as needed for wheezing or shortness of breath. 05/21/21   Banister, Pamela K, MD  amphetamine-dextroamphetamine (ADDERALL XR) 10 MG 24 hr  capsule Take 10 mg by mouth every morning.    [provider]  RANELL BECK 799-74 MCG/INH AEPB  10/21/18   [provider]  cetirizine HCl (ZYRTEC) 1 MG/ML solution Take by mouth daily.    [provider]  chlorhexidine (PERIDEX) 0.12 % solution  09/26/18   [provider]  chlorhexidine (PERIDEX) 0.12 % solution  09/26/18   [provider]  Cholecalciferol (VITAMIN D3) 75 MCG (3000 UT) TABS Take by mouth.    [provider]  cyclobenzaprine  (FLEXERIL ) 10 MG tablet Take 1 tablet (10 mg total) by mouth 3 (three) times daily as needed for muscle spasms. 12/19/20   Tobie Franky SQUIBB, DPM  ESTARYLLA 0.25-35 MG-MCG tablet Take 1 tablet by mouth daily.    [provider]  famotidine  (PEPCID ) 20 MG tablet Take 1 tablet (20 mg total) by mouth 2 (two) times daily. 10/27/22   Van Knee, MD  fluticasone  (FLONASE ) 50 MCG/ACT nasal spray Place into the nose. 08/23/15   [provider]  meloxicam  (MOBIC ) 15 MG tablet Take 1 tablet (15 mg total) by mouth daily. 03/03/23   Silva Juliene SAUNDERS, DPM  montelukast  (SINGULAIR ) 10 MG tablet Take by mouth. 07/10/15   [provider]  Multiple Vitamin (QUINTABS) TABS Take by mouth.    [provider]  predniSONE  (STERAPRED UNI-PAK 21 TAB) 10 MG (21) TBPK tablet Dispense one 6 day pack. Take as directed with food.  10/27/22   Van Knee, MD  Spacer/Aero-Holding Chambers (AEROCHAMBER PLUS) inhaler Use as instructed 05/04/17   Van Knee, MD    Family History Family History  Problem Relation Age of Onset   Healthy Mother    Healthy Father     Social History Social History   Tobacco Use   Smoking status: Never   Smokeless tobacco: Never  Vaping Use   Vaping status: Never Used  Substance Use Topics   Alcohol use: No   Drug use: No     Allergies   Penicillins, Shellfish allergy, and Pork allergy   Review of Systems Review of Systems  Constitutional:   Negative for fever.  Eyes:  Positive for pain, discharge and itching. Negative for photophobia, redness and visual disturbance.     Physical Exam Triage Vital Signs ED Triage Vitals  Encounter Vitals Group     BP      Girls Systolic BP Percentile      Girls Diastolic BP Percentile      Boys Systolic BP Percentile      Boys Diastolic BP Percentile      Pulse      Resp      Temp      Temp src      SpO2      Weight      Height      Head Circumference      Peak Flow      Pain Score      Pain Loc      Pain Education      Exclude from Growth Chart    No data found.  Updated Vital Signs BP 105/73 (BP Location: Left Arm)   Pulse 71   Temp 98.4 F (36.9 C) (Oral)   Resp 16   Wt 219 lb (99.3 kg)   LMP 09/10/2023   SpO2 99%   BMI 40.06 kg/m   Visual Acuity Right Eye Distance: 20/25 (corrected) Left Eye Distance: 20/40 (corrected) Bilateral Distance: 20/20 (corrected)  Right Eye Near:   Left Eye Near:    Bilateral Near:     Physical Exam Vitals and nursing note reviewed.  Constitutional:      Appearance: Normal appearance. She is not ill-appearing.  HENT:     Head: Normocephalic and atraumatic.  Eyes:     Extraocular Movements: Extraocular movements intact.     Conjunctiva/sclera: Conjunctivae normal.     Pupils: Pupils are equal, round, and reactive to light.     Comments: Left upper eyelid is edematous and mildly erythematous.  It is also tender to palpation.  No induration or fluctuance.  Skin:    General: Skin is warm and dry.     Capillary Refill: Capillary refill takes less than 2 seconds.     Findings: No erythema.  Neurological:     General: No focal deficit present.     Mental Status: She is alert and oriented to person, place, and time.      UC Treatments / Results  Labs (all labs ordered are listed, but only abnormal results are displayed) Labs Reviewed - No data to display  EKG   Radiology No results found.  Procedures Procedures  (including critical care time)  Medications Ordered in UC Medications - No data to display  Initial Impression / Assessment and Plan / UC Course  I have reviewed the triage vital signs and the nursing notes.  Pertinent labs & imaging results that were available during my care of  the patient were reviewed by me and considered in my medical decision making (see chart for details).   Patient is a pleasant, nontoxic-appearing 27 year old female presenting for evaluation of left eye irritation as outlined in the HPI above.  As you can see in image above, the left upper eyelid is mildly edematous and there is erythema towards the lateral aspect.  The bulbar and labral conjunctiva are unremarkable.  Patient's pupils equal round reactive and EOM's intact.  She has a normal red light reflex in the left eye.  I everted the eyelid to see if there was a stye present and I cannot appreciate 1.  The eyelid is tender but no induration or fluctuance.  Patient exam is consistent with blepharitis.  I will treat her with erythromycin  eye ointment twice daily for the first week followed by once nightly at bedtime for the following 2 weeks.  We have discussed applying warm compresses and doing eyelid hygiene.  Return precautions reviewed.  I have also cautioned her not to wear her contact lenses until after the infection has resolved.  If her symptoms do not improve she is to follow-up with her ophthalmologist.   Final Clinical Impressions(s) / UC Diagnoses   Final diagnoses:  Blepharitis of left upper eyelid, unspecified type     Discharge Instructions      Perform eyelid hygiene twice daily with baby shampoo worked into a Psychiatric nurse. Scrub your eyelids 10 times and rinse thoroughly.  Following the eyelid hygiene, using a clean Q-tip, apply a 1 inch ribbon of antibiotic ointment to your eyelash margin twice daily for the first week and then once daily at bedtime for an additional 2 weeks.  You may apply  warm compresses to your eye for 20-minute at a time, 2-3 times a day, to help with pain and inflammation.  Do not wear your contact lenses until after the first week of antibiotics.  If you develop any increased redness to your eyelid, swelling, pain, change in vision, or pus drainage you need to follow-up with ophthalmology.       ED Prescriptions     Medication Sig Dispense Auth. Provider   erythromycin  ophthalmic ointment Place a 1/2 inch ribbon of ointment into the lower eyelid. 3.5 g Bernardino Ditch, NP      PDMP not reviewed this encounter.   Bernardino Ditch, NP 09/27/23 [phone]

## 2023-09-27 NOTE — Discharge Instructions (Addendum)
 Perform eyelid hygiene twice daily with baby shampoo worked into a Psychiatric nurse. Scrub your eyelids 10 times and rinse thoroughly.  Following the eyelid hygiene, using a clean Q-tip, apply a 1 inch ribbon of antibiotic ointment to your eyelash margin twice daily for the first week and then once daily at bedtime for an additional 2 weeks.  You may apply warm compresses to your eye for 20-minute at a time, 2-3 times a day, to help with pain and inflammation.  Do not wear your contact lenses until after the first week of antibiotics.  If you develop any increased redness to your eyelid, swelling, pain, change in vision, or pus drainage you need to follow-up with ophthalmology.
